# Patient Record
Sex: Female | Born: 1970 | Race: Black or African American | Hispanic: No | Marital: Single | Smoking: Never smoker
Health system: Southern US, Community
[De-identification: ages and names within clinical notes are randomized; demographics above are authoritative.]

## PROBLEM LIST (undated history)

## (undated) DIAGNOSIS — D219 Benign neoplasm of connective and other soft tissue, unspecified: Secondary | ICD-10-CM

## (undated) DIAGNOSIS — D649 Anemia, unspecified: Secondary | ICD-10-CM

---

## 1998-03-29 ENCOUNTER — Inpatient Hospital Stay (HOSPITAL_COMMUNITY): Admission: AD | Admit: 1998-03-29 | Discharge: 1998-03-29 | Payer: Self-pay | Admitting: Obstetrics and Gynecology

## 1998-10-03 ENCOUNTER — Inpatient Hospital Stay (HOSPITAL_COMMUNITY): Admission: AD | Admit: 1998-10-03 | Discharge: 1998-10-03 | Payer: Self-pay | Admitting: Obstetrics and Gynecology

## 1998-10-12 ENCOUNTER — Encounter: Payer: Self-pay | Admitting: Obstetrics & Gynecology

## 1998-10-12 ENCOUNTER — Inpatient Hospital Stay (HOSPITAL_COMMUNITY): Admission: AD | Admit: 1998-10-12 | Discharge: 1998-10-12 | Payer: Self-pay | Admitting: Obstetrics & Gynecology

## 1998-10-18 ENCOUNTER — Inpatient Hospital Stay (HOSPITAL_COMMUNITY): Admission: AD | Admit: 1998-10-18 | Discharge: 1998-10-18 | Payer: Self-pay | Admitting: Obstetrics and Gynecology

## 1998-10-25 ENCOUNTER — Inpatient Hospital Stay (HOSPITAL_COMMUNITY): Admission: AD | Admit: 1998-10-25 | Discharge: 1998-10-28 | Payer: Self-pay | Admitting: Obstetrics and Gynecology

## 1998-12-27 ENCOUNTER — Other Ambulatory Visit: Admission: RE | Admit: 1998-12-27 | Discharge: 1998-12-27 | Payer: Self-pay | Admitting: Obstetrics and Gynecology

## 1999-12-24 ENCOUNTER — Other Ambulatory Visit: Admission: RE | Admit: 1999-12-24 | Discharge: 1999-12-24 | Payer: Self-pay | Admitting: Obstetrics and Gynecology

## 2007-01-28 ENCOUNTER — Emergency Department (HOSPITAL_COMMUNITY): Admission: EM | Admit: 2007-01-28 | Discharge: 2007-01-29 | Payer: Self-pay | Admitting: Emergency Medicine

## 2008-11-12 IMAGING — CT CT UROGRAM
2 of 3 series · 15 of 42 positions shown, 19 images · non-contrast
Comparison: NONE

CLINICAL DATA: Right flank pain and hematuria. 

CT UROGRAM
TECHNIQUE: Thin-section unenhanced axial images were obtained to 
provide a CT urogram to evaluate for possible urinary tract stone. 
 Scan thicknesses were 3.0 mm with 3.0-mm increments.

[Series 2: wo · axial · 0.68mm/px · z∈[+541,+898]mm · 12 of 135 slices shown, 16 images]
[im 8/135  soft-tissue]
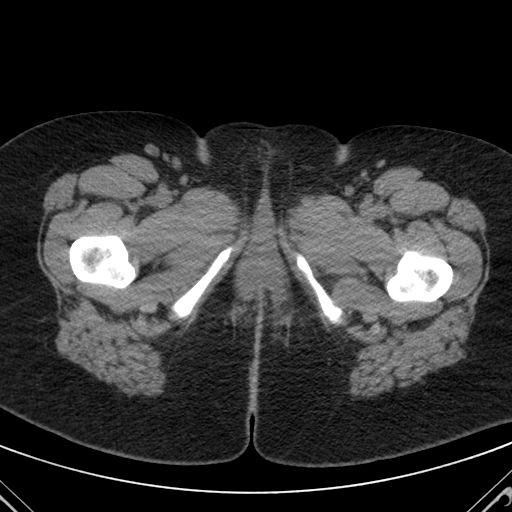
[im 8/135  bone]
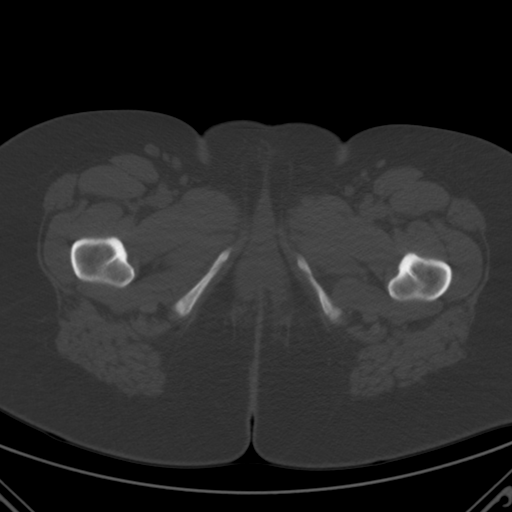
[im 22/135  soft-tissue]
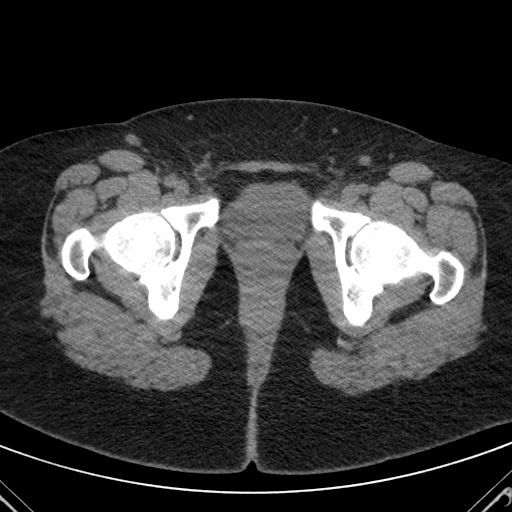
[im 36/135  soft-tissue]
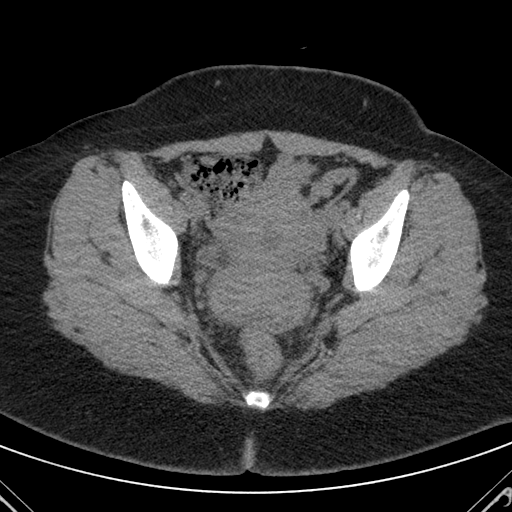
[im 50/135  soft-tissue]
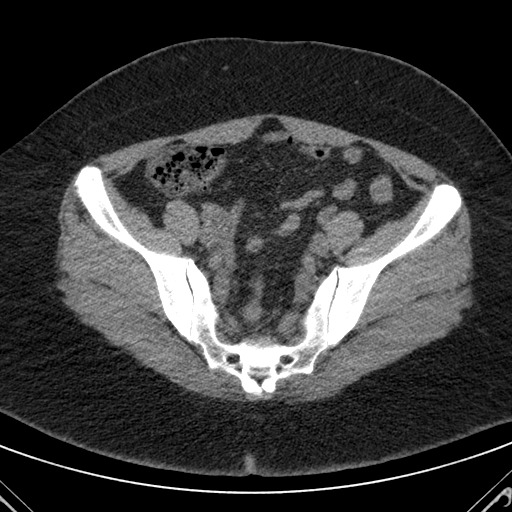
[im 64/135  soft-tissue]
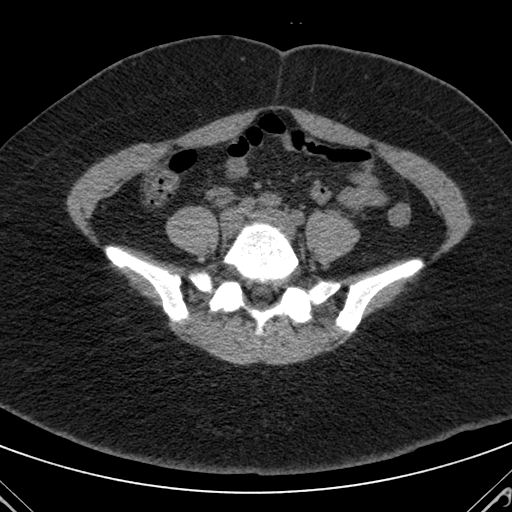
[im 71/135  soft-tissue]
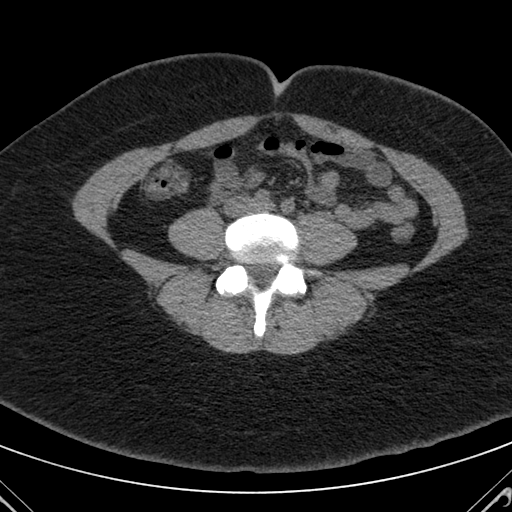
[im 85/135  soft-tissue]
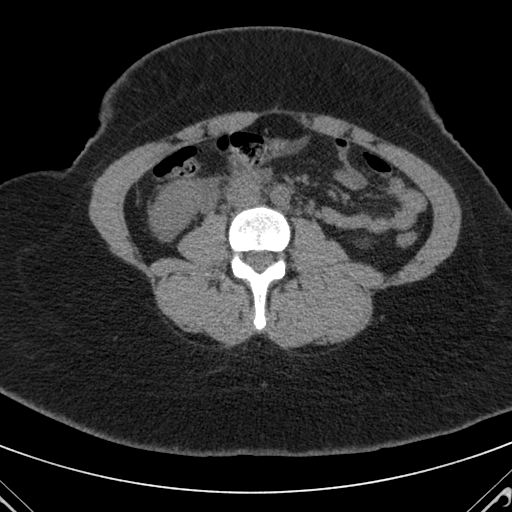
[im 99/135  soft-tissue]
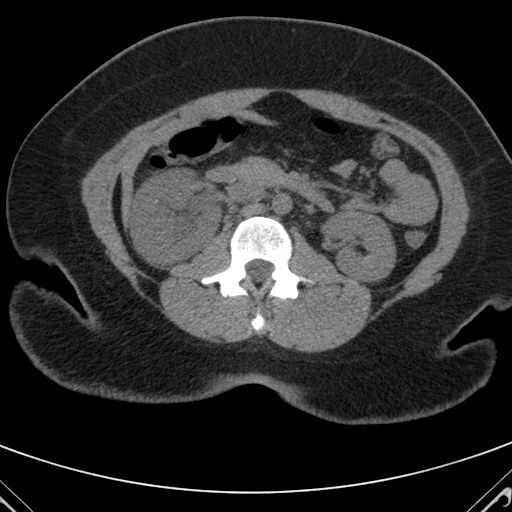
[im 106/135  lung]
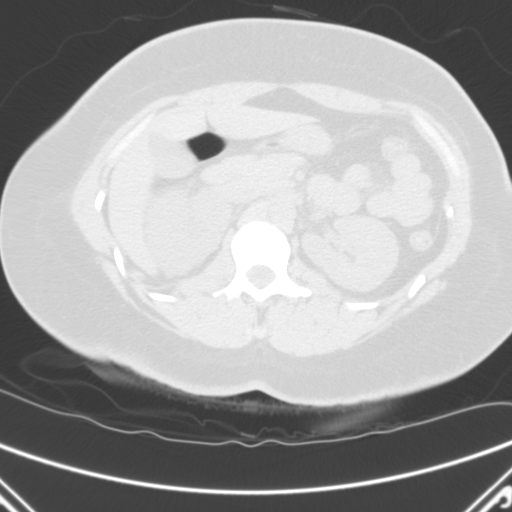
[im 113/135  soft-tissue]
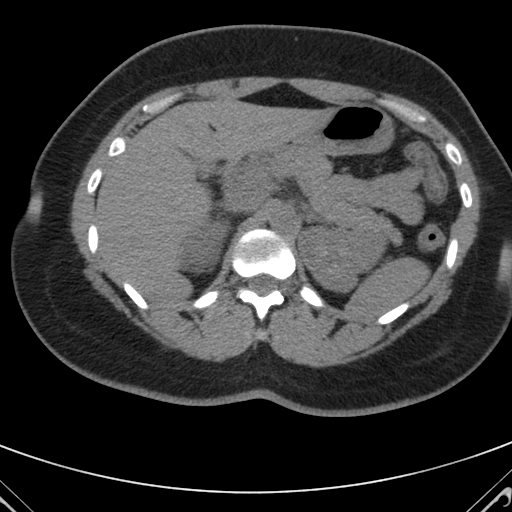
[im 113/135  lung]
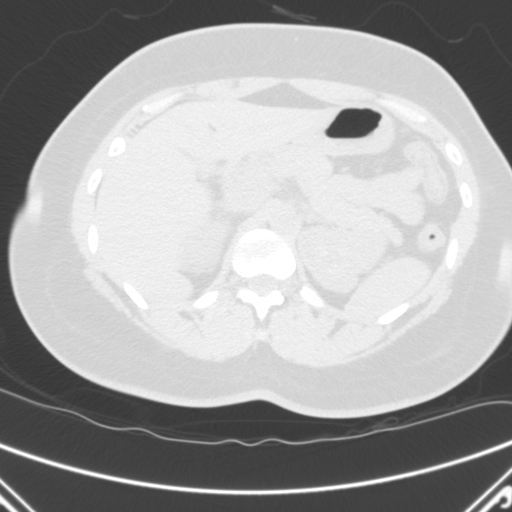
[im 113/135  bone]
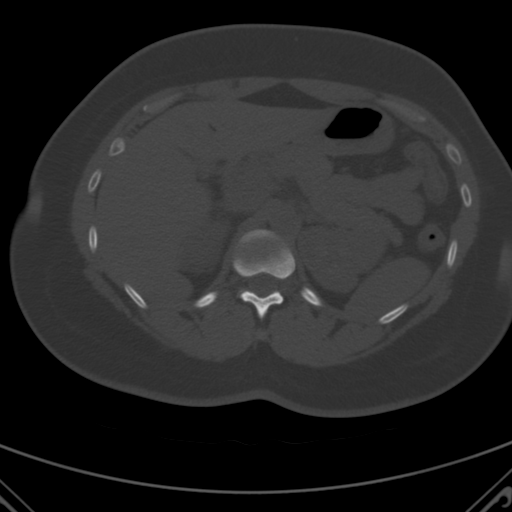
[im 120/135  lung]
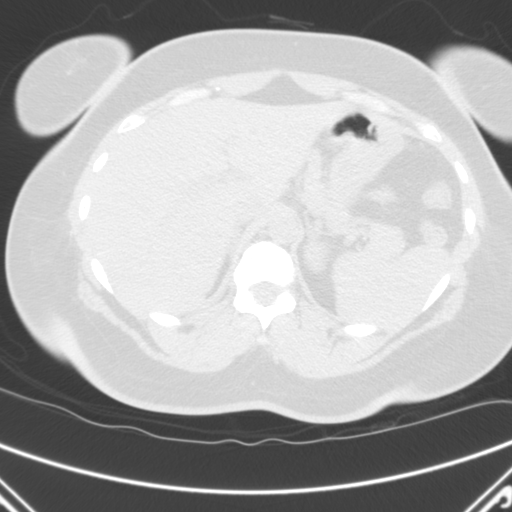
[im 127/135  soft-tissue]
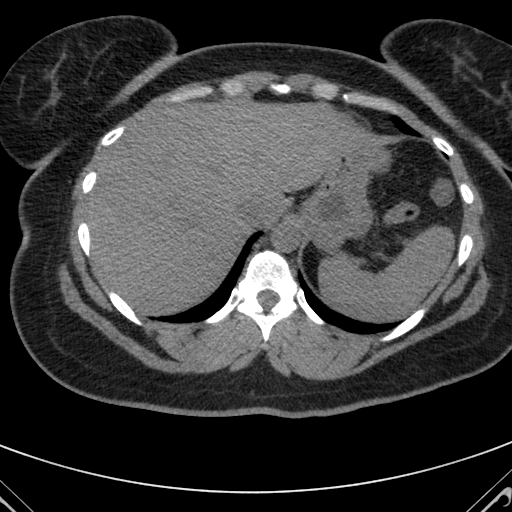
[im 127/135  lung]
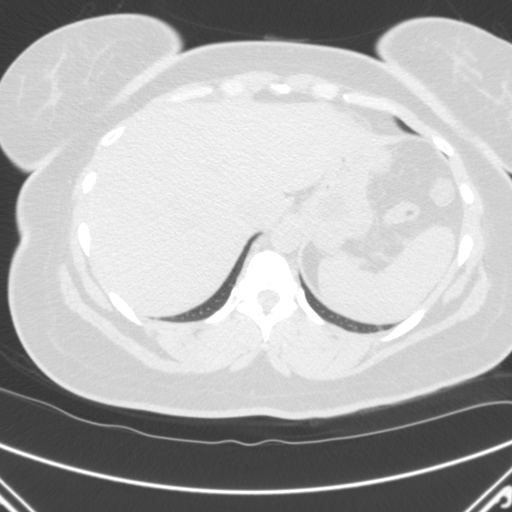

[coronals · coronal · 0.78mm/px · 3 of 69 slices shown]
[im 23/69  soft-tissue]
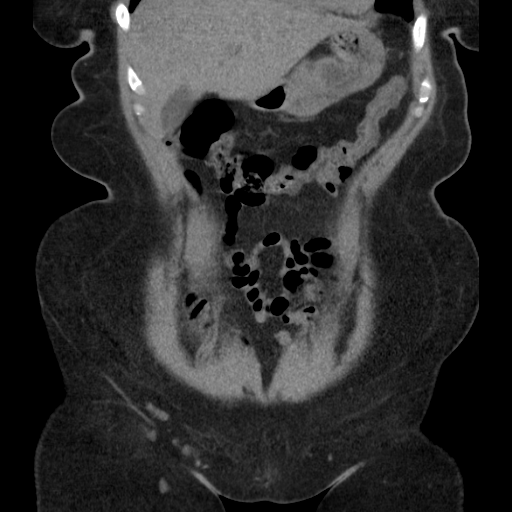
[im 31/69  soft-tissue]
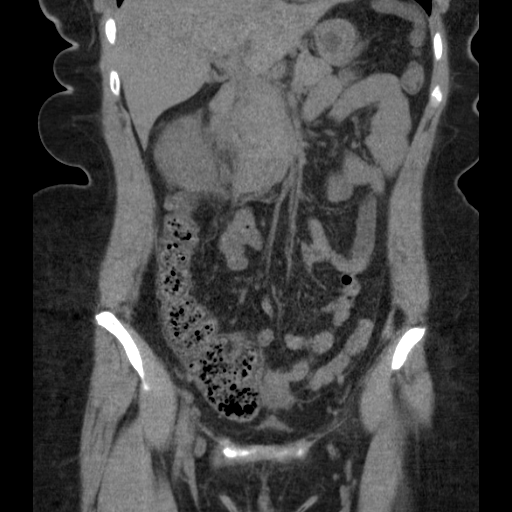
[im 38/69  soft-tissue]
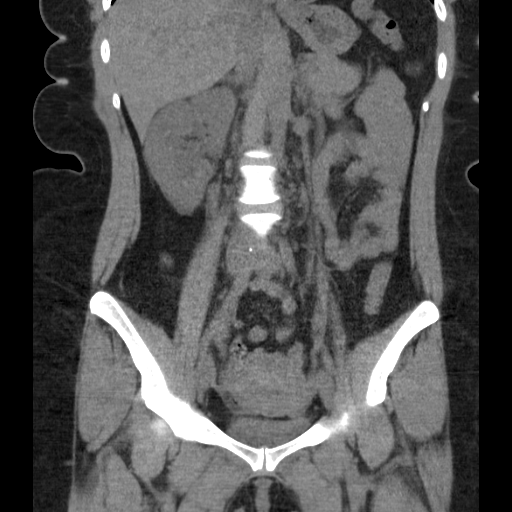

[15 of 42 positions shown; findings below may reference images not displayed]

FINDINGS: There are no prior CT studies available for comparison. 
Evaluation of the intra-abdominal organs is limited without 
intravenous contrast.  The visualized portion of the liver is 
grossly within normal limits.  The spleen, pancreas, gallbladder, 
and adrenal glands are grossly within normal limits.  There is a 
calcified stone seen in the midpole of the left kidney that 
measures approximately 6.0 mm in diameter.  There is no evidence 
left hydronephrosis or hydroureter.  There is a smaller, 
approximately 2.0-mm stone in the mid-upper pole of the left 
kidney.  A calcified stone is seen in the mid-lower pole of the 
right kidney that measures approximately 5.0 mm in length.  There 
is a smaller approximately 2.0-mm stone in the midpole of the 
right kidney.  There is moderate right hydronephrosis and moderate 
right hydroureter.  There is a calcified stone seen in the distal 
right ureter that measures approximatey 3.5x6.5 mm.  There is no 
calcified stone seen in the bladder.  The uterus is grossly within 
normal limits.  The ovaries are not well-seen. No free fluid or 
aortic aneurysm.  No anterior abdominal wall hernia or inguinal 
hernia.  There is no evidence of lymphadenopathy.  No inflammatory 
changes are seen in the abdomen or pelvis.
IMPRESSION: Bilateral nephrolithiasis. Moderate right 
hydronephrosis and right hydroureter due to a 3.5x6.5-mm calcified 
stone seen in the distal right ureter near the ureterovesical 
Dict Date: 01/28/2007  Trans Date: 01/28/2007 JH  JLM

## 2010-03-04 ENCOUNTER — Other Ambulatory Visit: Admission: RE | Admit: 2010-03-04 | Discharge: 2010-03-04 | Payer: Self-pay | Admitting: Family Medicine

## 2011-03-13 LAB — URINE MICROSCOPIC-ADD ON

## 2011-03-13 LAB — I-STAT 8, (EC8 V) (CONVERTED LAB)
Acid-base deficit: 6 — ABNORMAL HIGH
Hemoglobin: 11.2 — ABNORMAL LOW
Operator id: 272551
Potassium: 3.1 — ABNORMAL LOW
Sodium: 139
TCO2: 20

## 2011-03-13 LAB — POCT I-STAT CREATININE
Creatinine, Ser: 0.9
Operator id: 272551

## 2011-03-13 LAB — URINALYSIS, ROUTINE W REFLEX MICROSCOPIC
Glucose, UA: NEGATIVE
Ketones, ur: 15 — AB
Leukocytes, UA: NEGATIVE
Nitrite: NEGATIVE
Protein, ur: 30 — AB
Specific Gravity, Urine: 1.036 — ABNORMAL HIGH
Urobilinogen, UA: 1
pH: 6

## 2012-03-13 ENCOUNTER — Emergency Department (HOSPITAL_COMMUNITY)
Admission: EM | Admit: 2012-03-13 | Discharge: 2012-03-14 | Disposition: A | Discharge status: Home / Self Care | Payer: Medicaid Other | Attending: Emergency Medicine | Admitting: Emergency Medicine

## 2012-03-13 ENCOUNTER — Encounter (HOSPITAL_COMMUNITY): Payer: Self-pay | Admitting: *Deleted

## 2012-03-13 DIAGNOSIS — M79609 Pain in unspecified limb: Secondary | ICD-10-CM | POA: Insufficient documentation

## 2012-03-13 DIAGNOSIS — I2510 Atherosclerotic heart disease of native coronary artery without angina pectoris: Secondary | ICD-10-CM | POA: Insufficient documentation

## 2012-03-13 DIAGNOSIS — M542 Cervicalgia: Secondary | ICD-10-CM | POA: Insufficient documentation

## 2012-03-13 DIAGNOSIS — R079 Chest pain, unspecified: Secondary | ICD-10-CM | POA: Insufficient documentation

## 2012-03-13 DIAGNOSIS — N920 Excessive and frequent menstruation with regular cycle: Secondary | ICD-10-CM | POA: Insufficient documentation

## 2012-03-13 DIAGNOSIS — R000 Tachycardia, unspecified: Secondary | ICD-10-CM | POA: Insufficient documentation

## 2012-03-13 DIAGNOSIS — D649 Anemia, unspecified: Secondary | ICD-10-CM | POA: Insufficient documentation

## 2012-03-13 DIAGNOSIS — J3489 Other specified disorders of nose and nasal sinuses: Secondary | ICD-10-CM | POA: Insufficient documentation

## 2012-03-13 DIAGNOSIS — IMO0001 Reserved for inherently not codable concepts without codable children: Secondary | ICD-10-CM | POA: Insufficient documentation

## 2012-03-13 LAB — BASIC METABOLIC PANEL
BUN: 9 mg/dL (ref 6–23)
Calcium: 9.2 mg/dL (ref 8.4–10.5)
GFR calc Af Amer: >90 mL/min (ref >90)
GFR calc non Af Amer: >90 mL/min (ref >90)
Potassium: 3.4 mEq/L — ABNORMAL LOW (ref 3.5–5.1)
Sodium: 135 mEq/L (ref 135–145)

## 2012-03-13 LAB — CBC
MCH: 25.3 pg — ABNORMAL LOW (ref 26.0–34.0)
MCHC: 32.4 g/dL (ref 30.0–36.0)
RDW: 17.9 % — ABNORMAL HIGH (ref 11.5–15.5)

## 2012-03-13 MED ORDER — IBUPROFEN 200 MG PO TABS
600.0000 mg | ORAL_TABLET | Freq: Once | ORAL | Status: AC
  Administered 2012-03-13: 600 mg via ORAL
  Filled 2012-03-13: qty 1

## 2012-03-13 MED ORDER — SODIUM CHLORIDE 0.9 % IV BOLUS (SEPSIS)
1000.0000 mL | Freq: Once | INTRAVENOUS | Status: AC
  Administered 2012-03-13: 1000 mL via INTRAVENOUS

## 2012-03-13 NOTE — ED Provider Notes (Signed)
History     CSN: [id number]  Arrival date & time 03/13/12  1946   First MD Initiated Contact with Patient 03/13/12 2037      Chief Complaint  Patient presents with  . Chest Pain  . Tachycardia    (Consider location/radiation/quality/duration/timing/severity/associated sxs/prior treatment) HPI Kaitlin Harris is a 41 y.o. female without any history of coronary artery disease, or heart problems in the past presents with 3 days history of sensation in her heart rate is been going fast, feeling hot with some chest pain that has been burning, sharp, intermittent-lasting less than 5 minutes, she's had some associated left arm pains and neck pains. She says the pain does not radiate from her chest to her arm or neck. She still having it today and it comes and goes. Patient says she's also been achy all over. She denies shortness of breath. She's had no cough, no nausea vomiting or diarrhea, no abdominal pain, no dysuria, she's had some minor nasal congestion. No documented fevers at home. No history of blood clots in the legs or lungs.   History reviewed. No pertinent past medical history.  History reviewed. No pertinent past surgical history.  No family history on file.  History  Substance Use Topics  . Smoking status: Not on file  . Smokeless tobacco: Not on file  . Alcohol Use: Not on file    OB History    No data available      Review of Systems At least 10pt or greater review of systems completed and are negative except where specified in the HPI.  Allergies  Review of patient's allergies indicates no known allergies.  Home Medications  No current outpatient prescriptions on file.  BP 147/86  Temp 98.8 F (37.1 C) (Oral)  Resp 20  SpO2 98%  Physical Exam  Nursing notes reviewed.  Electronic medical record reviewed. VITAL SIGNS:   Filed Vitals:   03/13/12 2330 03/14/12 0030 03/14/12 0100 03/14/12 0145  BP: 116/69 111/67 103/63 114/75  Pulse:      Temp:     98.2 F (36.8 C)  TempSrc:      Resp: 13  12   SpO2:    100%   CONSTITUTIONAL: Awake, oriented, appears non-toxic HENT: Atraumatic, normocephalic, oral mucosa pink and moist, airway patent. Nares patent with clear drainage and moderately boggy turbinates. External ears normal. EYES: Conjunctiva clear, EOMI, PERRLA NECK: Trachea midline, non-tender, supple CARDIOVASCULAR: Normal heart rate, Normal rhythm, No murmurs, rubs, gallops PULMONARY/CHEST: Clear to auscultation, no rhonchi, wheezes, or rales. Symmetrical breath sounds. Non-tender. ABDOMINAL: Non-distended, soft, non-tender - no rebound or guarding.  BS normal. NEUROLOGIC: Non-focal, moving all four extremities, no gross sensory or motor deficits. EXTREMITIES: No clubbing, cyanosis, or edema SKIN: Warm, Dry, No erythema, No rash  ED Course  Procedures (including critical care time)  Date: 03/14/2012 19:57:41  Rate: 107  Rhythm: sinus rhythm  QRS Axis: normal  Intervals: normal  ST/T Wave abnormalities: normal  Conduction Disutrbances: none  Narrative Interpretation:  sinus tachycardia, RSR prime in V1 - no other evidence of RBBB    Date: 03/14/2012 01:02:57  Rate: 72  Rhythm: normal sinus rhythm  QRS Axis: normal  Intervals: normal  ST/T Wave abnormalities: normal  Conduction Disutrbances: none  Narrative Interpretation:  sinus tachycardia, RSR prime in V1 - no other evidence of RBBB    Labs Reviewed  CBC - Abnormal; Notable for the following:    RBC 3.60 (*)     Hemoglobin 9.1 (*)  HCT 28.1 (*)     MCH 25.3 (*)     RDW 17.9 (*)     All other components within normal limits  BASIC METABOLIC PANEL - Abnormal; Notable for the following:    Potassium 3.4 (*)     All other components within normal limits  TROPONIN I  TROPONIN I   No results found.   1. Chest pain   2. Anemia   3. Menorrhagia       MDM  Kaitlin Harris is a 41 y.o. female presenting with chest pain. She has some minor nasal  congestion. Chest pain is certainly atypical.  The emergent differential diagnosis of chest pain includes: Acute coronary syndrome, pericarditis, aortic dissection, pulmonary embolism, tension pneumothorax, and esophageal rupture. Based on the patient's benign presentation-I do not think she has acute coronary syndrome-she denies any viral prodrome and her pain is not positional, it is simply intermittent at rest. Likewise she looks good I do not think she's got an aortic dissection, her well score is 1 giving her very low pretest probability for having a PE. She's not short of breath, she simply tachycardic. She's had no appetite has had decreased by mouth intake since Friday. She's got some nasal congestion and some myalgias these are consistent with a viral process. We'll obtain cardiac biomarkers and basic labs, rule out ACS and carditis.  Do not think a chest x-ray is indicated at this time.  Patient does have a history of heavy menstrual periods she does have a prior history of also being anemic.  Patient's labs show the patient is indeed anemic with a hemoglobin of 9.1 hematocrit 28.1. Otherwise cardiac biomarkers are within normal limits and BMP is unremarkable. EKGs are unchanged and unremarkable. We'll put the patient on a regimen of iron-to include ferrous sulfate on a bowel regimen to prevent constipation. She currently does not have a primary care physician-have given her a resource guide for which to find a PCM.  I explained the diagnosis and have given explicit precautions to return to the ER including persistent chest pain, high fevers, vomiting, shortness of breath or any other new or worsening symptoms. The patient understands and accepts the medical plan as it's been dictated and I have answered their questions. Discharge instructions concerning home care and prescriptions have been given.  The patient is STABLE and is discharged to home in good condition.     Jones Skene,  MD 03/14/12 [id number]

## 2012-03-13 NOTE — ED Notes (Addendum)
Pt arrived via [facility] c/o left sided axillary pain radiating left shoulder and down left arm. Pain currently resolved. Sinus tachycardia during transport with EMS. 324 ASA administered prior to arrival at approximately 1915

## 2012-03-14 MED ORDER — FERROUS SULFATE 325 (65 FE) MG PO TABS: 325.0000 mg | ORAL_TABLET | Freq: Three times a day (TID) | ORAL | Status: DC

## 2012-03-14 MED ORDER — SODIUM CHLORIDE 0.9 % IV BOLUS (SEPSIS)
1000.0000 mL | Freq: Once | INTRAVENOUS | Status: AC
  Administered 2012-03-14: 1000 mL via INTRAVENOUS

## 2012-03-14 MED ORDER — POLYETHYLENE GLYCOL 3350 17 GM/SCOOP PO POWD: 17.0000 g | Freq: Every day | ORAL | Status: DC

## 2012-03-14 MED ORDER — ONDANSETRON HCL 4 MG/2ML IJ SOLN
4.0000 mg | Freq: Once | INTRAMUSCULAR | Status: AC
  Administered 2012-03-14: 4 mg via INTRAVENOUS
  Filled 2012-03-14: qty 2

## 2012-03-14 NOTE — ED Notes (Signed)
Pt states understanding of discharge instructions 

## 2012-03-28 ENCOUNTER — Emergency Department (HOSPITAL_COMMUNITY): Payer: Medicaid Other

## 2012-03-28 ENCOUNTER — Encounter (HOSPITAL_COMMUNITY): Payer: Self-pay | Admitting: *Deleted

## 2012-03-28 ENCOUNTER — Emergency Department (HOSPITAL_COMMUNITY)
Admission: EM | Admit: 2012-03-28 | Discharge: 2012-03-28 | Disposition: A | Discharge status: Home / Self Care | Payer: Medicaid Other | Attending: Emergency Medicine | Admitting: Emergency Medicine

## 2012-03-28 DIAGNOSIS — R5383 Other fatigue: Secondary | ICD-10-CM

## 2012-03-28 DIAGNOSIS — R002 Palpitations: Secondary | ICD-10-CM | POA: Insufficient documentation

## 2012-03-28 DIAGNOSIS — R5381 Other malaise: Secondary | ICD-10-CM | POA: Insufficient documentation

## 2012-03-28 DIAGNOSIS — Z79899 Other long term (current) drug therapy: Secondary | ICD-10-CM | POA: Insufficient documentation

## 2012-03-28 DIAGNOSIS — R059 Cough, unspecified: Secondary | ICD-10-CM | POA: Insufficient documentation

## 2012-03-28 DIAGNOSIS — R42 Dizziness and giddiness: Secondary | ICD-10-CM | POA: Insufficient documentation

## 2012-03-28 DIAGNOSIS — R05 Cough: Secondary | ICD-10-CM | POA: Insufficient documentation

## 2012-03-28 DIAGNOSIS — R0602 Shortness of breath: Secondary | ICD-10-CM | POA: Insufficient documentation

## 2012-03-28 LAB — POCT I-STAT TROPONIN I: Troponin i, poc: 0.01 ng/mL (ref 0.00–0.08)

## 2012-03-28 LAB — CBC
MCHC: 33.2 g/dL (ref 30.0–36.0)
MCV: 79.1 fL (ref 78.0–100.0)
Platelets: 227 10*3/uL (ref 150–400)
RDW: 17.7 % — ABNORMAL HIGH (ref 11.5–15.5)
WBC: 4.1 10*3/uL (ref 4.0–10.5)

## 2012-03-28 LAB — BASIC METABOLIC PANEL
BUN: 6 mg/dL (ref 6–23)
Calcium: 9.3 mg/dL (ref 8.4–10.5)
Chloride: 96 mEq/L (ref 96–112)
Creatinine, Ser: 0.59 mg/dL (ref 0.50–1.10)
GFR calc Af Amer: >90 mL/min (ref >90)
GFR calc non Af Amer: >90 mL/min (ref >90)

## 2012-03-28 MED ORDER — SODIUM CHLORIDE 0.9 % IV BOLUS (SEPSIS)
1000.0000 mL | Freq: Once | INTRAVENOUS | Status: AC
  Administered 2012-03-28: 1000 mL via INTRAVENOUS

## 2012-03-28 MED ORDER — POTASSIUM CHLORIDE CRYS ER 20 MEQ PO TBCR
40.0000 meq | EXTENDED_RELEASE_TABLET | Freq: Once | ORAL | Status: AC
  Administered 2012-03-28: 40 meq via ORAL
  Filled 2012-03-28: qty 2

## 2012-03-28 MED ORDER — LORAZEPAM 2 MG/ML IJ SOLN
1.0000 mg | Freq: Once | INTRAMUSCULAR | Status: AC
  Administered 2012-03-28: 1 mg via INTRAVENOUS
  Filled 2012-03-28: qty 1

## 2012-03-28 NOTE — ED Notes (Signed)
Pt states that she had increasing generalized weakness and fatigue today.  Pt states that she has had palpitations also.  Pt was recently seen for same (on 10/14) and never fully recovered from her symptoms.  Pt has been feeling weak and drowsy for one week.  Pt began having palpitations (heart racing) and diaphoresis today at 1am.  Pt was told last time she was seen that she has a virus and anemia.  Pt appears pale.  No CP, mild sob with this.  Pt denies any rectal bleeding and reports that her stools have been dark (not black).

## 2012-03-28 NOTE — ED Provider Notes (Signed)
History     CSN: [id number]  Arrival date & time 03/28/12  0228   First MD Initiated Contact with Patient 03/28/12 [phone]      Chief Complaint  Patient presents with  . Fatigue  . Palpitations    (Consider location/radiation/quality/duration/timing/severity/associated sxs/prior treatment) HPI HX per PT< fatigue and feeling weak all over last few weeks, tonight at home developed palpitations lasting about an hour. Was evaluated for the same 2 weeks ago and told she was anemic, she denies any rectal bleeding. LMP last  Month on time and normal. Has had a few of these episodes in the last few weeks. No CP. Some SOB with palpitations, those symptoms now gone. Mild dry cough and congestion - not taking any medications for this.  No h/o thyroid problems History reviewed. No pertinent past medical history.  History reviewed. No pertinent past surgical history.  Family History  Problem Relation Age of Onset  . Asthma Mother   . Thyroid disease Sister   . Asthma Brother     History  Substance Use Topics  . Smoking status: Never Smoker   . Smokeless tobacco: Never Used  . Alcohol Use: 0.6 oz/week    1 Glasses of wine per week    OB History    Grav Para Term Preterm Abortions TAB SAB Ect Mult Living                  Review of Systems  Constitutional: Negative for fever and chills.  HENT: Negative for neck pain and neck stiffness.   Eyes: Negative for pain.  Respiratory: Positive for cough. Negative for wheezing.   Cardiovascular: Negative for chest pain.  Gastrointestinal: Negative for nausea, vomiting and abdominal pain.  Genitourinary: Negative for dysuria.  Musculoskeletal: Negative for back pain.  Skin: Negative for rash.  Neurological: Positive for dizziness and weakness. Negative for headaches.  All other systems reviewed and are negative.    Allergies  Review of patient's allergies indicates no known allergies.  Home Medications   Current Outpatient Rx  Name  Route Sig Dispense Refill  . FERROUS SULFATE 325 (65 FE) MG PO TABS Oral Take 1 tablet (325 mg total) by mouth 3 (three) times daily with meals. 90 tablet 3  . ADULT MULTIVITAMIN W/MINERALS CH Oral Take 1 tablet by mouth daily.      BP 159/103  Pulse 108  Temp 97.8 F (36.6 C) (Oral)  Resp 18  SpO2 100%  LMP 03/13/2012  Physical Exam  Constitutional: She is oriented to person, place, and time. She appears well-developed and well-nourished.  HENT:  Head: Normocephalic and atraumatic.  Eyes: Conjunctivae normal and EOM are normal. Pupils are equal, round, and reactive to light.  Neck: Trachea normal. Neck supple. No thyromegaly present.  Cardiovascular: Regular rhythm, S1 normal, S2 normal and normal pulses.     No systolic murmur is present   No diastolic murmur is present  Pulses:      Radial pulses are 2+ on the right side, and 2+ on the left side.       Mild tachycardia  Pulmonary/Chest: Effort normal and breath sounds normal. She has no wheezes. She has no rhonchi. She has no rales. She exhibits no tenderness.  Abdominal: Soft. Normal appearance and bowel sounds are normal. There is no tenderness. There is no CVA tenderness and negative Murphy's sign.  Genitourinary:       Guaiac neg brown stool  Musculoskeletal:       BLE:s Calves  nontender, no cords or erythema, negative Homans sign  Neurological: She is alert and oriented to person, place, and time. She has normal strength. No cranial nerve deficit or sensory deficit. GCS eye subscore is 4. GCS verbal subscore is 5. GCS motor subscore is 6.  Skin: Skin is warm and dry. No rash noted. She is not diaphoretic.  Psychiatric: Her speech is normal.       Cooperative and appropriate    ED Course  Procedures (including critical care time)  Results for orders placed during the hospital encounter of 03/28/12  CBC      Component Value Range   WBC 4.1  4.0 - 10.5 K/uL   RBC 3.92  3.87 - 5.11 MIL/uL   Hemoglobin 10.3 (*) 12.0 -  15.0 g/dL   HCT 16.1 (*) 09.6 - 04.5 %   MCV 79.1  78.0 - 100.0 fL   MCH 26.3  26.0 - 34.0 pg   MCHC 33.2  30.0 - 36.0 g/dL   RDW 40.9 (*) 81.1 - 91.4 %   Platelets 227  150 - 400 K/uL  BASIC METABOLIC PANEL      Component Value Range   Sodium 133 (*) 135 - 145 mEq/L   Potassium 3.2 (*) 3.5 - 5.1 mEq/L   Chloride 96  96 - 112 mEq/L   CO2 22  19 - 32 mEq/L   Glucose, Bld 87  70 - 99 mg/dL   BUN 6  6 - 23 mg/dL   Creatinine, Ser 7.82  0.50 - 1.10 mg/dL   Calcium 9.3  8.4 - 95.6 mg/dL   GFR calc non Af Amer >90  >90 mL/min   GFR calc Af Amer >90  >90 mL/min  POCT I-STAT TROPONIN I      Component Value Range   Troponin i, poc 0.01  0.00 - 0.08 ng/mL   Comment 3           OCCULT BLOOD, POC DEVICE      Component Value Range   Fecal Occult Bld NEGATIVE     Dg Chest Portable 1 View  03/28/2012  *RADIOLOGY REPORT*  Clinical Data: Fatigue.  Chest palpitations.  PORTABLE CHEST - 1 VIEW  Comparison: None.  Findings: Lungs are clear.  Heart size is normal.  No pneumothorax or pleural fluid.  IMPRESSION: Negative chest.   Original Report Authenticated By: Bernadene Bell. Maricela Curet, M.D.      Date: 03/28/2012  Rate: 97  Rhythm: normal sinus rhythm  QRS Axis: normal  Intervals: normal  ST/T Wave abnormalities: nonspecific ST changes  Conduction Disutrbances:none  Narrative Interpretation:   Old EKG Reviewed: unchanged  IVFs. IV ativan. Labs and CXR  Potassium for hypokalemia.   4:39 AM recheck feels improved - mild tachycardia improved.   Plan outpatient follow up - PT agrees to have thyroid checked and see primary care physician for further work up.  MDM   Fatigue and palpitations x 2 weeks intermittent, known anemia, no active bleeding or change in menses, taking iron pills. Work up with ECG, CXR and labs as above. Hypokalemia treated. VS and nursing notes reviewed.          Sunnie Nielsen, MD 03/28/12 [phone]

## 2012-06-08 ENCOUNTER — Encounter (HOSPITAL_COMMUNITY): Payer: Self-pay

## 2012-06-08 ENCOUNTER — Emergency Department (INDEPENDENT_AMBULATORY_CARE_PROVIDER_SITE_OTHER)
Admission: EM | Admit: 2012-06-08 | Discharge: 2012-06-08 | Disposition: A | Discharge status: Home / Self Care | Payer: Self-pay | Source: Home / Self Care

## 2012-06-08 DIAGNOSIS — F329 Major depressive disorder, single episode, unspecified: Secondary | ICD-10-CM

## 2012-06-08 DIAGNOSIS — F411 Generalized anxiety disorder: Secondary | ICD-10-CM

## 2012-06-08 DIAGNOSIS — F419 Anxiety disorder, unspecified: Secondary | ICD-10-CM

## 2012-06-08 DIAGNOSIS — N920 Excessive and frequent menstruation with regular cycle: Secondary | ICD-10-CM

## 2012-06-08 DIAGNOSIS — I10 Essential (primary) hypertension: Secondary | ICD-10-CM

## 2012-06-08 DIAGNOSIS — R5382 Chronic fatigue, unspecified: Secondary | ICD-10-CM

## 2012-06-08 DIAGNOSIS — D649 Anemia, unspecified: Secondary | ICD-10-CM

## 2012-06-08 DIAGNOSIS — R5381 Other malaise: Secondary | ICD-10-CM

## 2012-06-08 LAB — COMPREHENSIVE METABOLIC PANEL
AST: 17 U/L (ref 0–37)
Albumin: 4.3 g/dL (ref 3.5–5.2)
Calcium: 9.6 mg/dL (ref 8.4–10.5)
Creatinine, Ser: 0.54 mg/dL (ref 0.50–1.10)

## 2012-06-08 LAB — CBC
MCH: 28.4 pg (ref 26.0–34.0)
MCV: 83.1 fL (ref 78.0–100.0)
Platelets: 185 10*3/uL (ref 150–400)
RDW: 15.2 % (ref 11.5–15.5)
WBC: 3.1 10*3/uL — ABNORMAL LOW (ref 4.0–10.5)

## 2012-06-08 LAB — VITAMIN B12: Vitamin B-12: 944 pg/mL — ABNORMAL HIGH (ref 211–911)

## 2012-06-08 MED ORDER — HYDROCHLOROTHIAZIDE 12.5 MG PO TABS: 12.5000 mg | ORAL_TABLET | Freq: Every day | ORAL | Status: DC

## 2012-06-08 MED ORDER — CITALOPRAM HYDROBROMIDE 20 MG PO TABS: 20.0000 mg | ORAL_TABLET | Freq: Every day | ORAL | Status: DC

## 2012-06-08 MED ORDER — LORAZEPAM 0.5 MG PO TABS: 0.5000 mg | ORAL_TABLET | Freq: Every evening | ORAL | Status: DC | PRN

## 2012-06-08 MED ORDER — POTASSIUM CHLORIDE ER 10 MEQ PO TBCR: 10.0000 meq | EXTENDED_RELEASE_TABLET | Freq: Every day | ORAL | Status: DC

## 2012-06-08 NOTE — ED Notes (Signed)
Patient states has been feeling weak, feet and hand tingly  At times. Has lost weight appetite is down. Was seen in the ED twice in the month of Oct. Was told she was anemic and had a bacterial infection. Still does not feel 100 %

## 2012-06-08 NOTE — Discharge Instructions (Signed)
Anemia, Frequently Asked Questions WHAT ARE THE SYMPTOMS OF ANEMIA?  Headache.  Difficulty thinking.  Fatigue.  Shortness of breath.  Weakness.  Rapid heartbeat. AT WHAT POINT ARE PEOPLE CONSIDERED ANEMIC?  This varies with gender and age.   Both hemoglobin (Hgb) and hematocrit values are used to define anemia. These lab values are obtained from a complete blood count (CBC) test. This is performed at a caregiver's office.  The normal range of hemoglobin values for adult men is 14.0 g/dL to 16.1 g/dL. For nonpregnant women, values are 12.3 g/dL to 09.6 g/dL.  The World Health Organization defines anemia as less than 12 g/dL for nonpregnant women and less than 13 g/dL for men.  For adult males, the average normal hematocrit is 46%, and the range is 40% to 52%.  For adult females, the average normal hematocrit is 41%, and the range is 35% to 47%.  Values that fall below the lower limits can be a sign of anemia and should have further checking (evaluation). GROUPS OF PEOPLE WHO ARE AT RISK FOR DEVELOPING ANEMIA INCLUDE:   Infants who are breastfed or taking a formula that is not fortified with iron.  Children going through a rapid growth spurt. The iron available can not keep up with the needs for a red cell mass which must grow with the child.  Women in childbearing years. They need iron because of blood loss during menstruation.  Pregnant women. The growing fetus creates a high demand for iron.  People with ongoing gastrointestinal blood loss are at risk of developing iron deficiency.  Individuals with leukemia or cancer who must receive chemotherapy or radiation to treat their disease. The drugs or radiation used to treat these diseases often decreases the bone marrow's ability to make cells of all classes. This includes red blood cells, white blood cells, and platelets.  Individuals with chronic inflammatory conditions such as rheumatoid arthritis or chronic  infections.  The elderly. ARE SOME TYPES OF ANEMIA INHERITED?   Yes, some types of anemia are due to inherited or genetic defects.  Sickle cell anemia. This occurs most often in people of African, African American, and Mediterranean descent.  Thalassemia (or Cooley's anemia). This type is found in people of Mediterranean and Southeast Asian descent. These types of anemia are common.  [place]. This is rare. CAN CERTAIN MEDICATIONS CAUSE A PERSON TO BECOME ANEMIC?  Yes. For example, drugs to fight cancer (chemotherapeutic agents) often cause anemia. These drugs can slow the bone marrow's ability to make red blood cells. If there are not enough red blood cells, the body does not get enough oxygen. WHAT HEMATOCRIT LEVEL IS REQUIRED TO DONATE BLOOD?  The lower limit of an acceptable hematocrit for blood donors is 38%. If you have a low hematocrit value, you should schedule an appointment with your caregiver. ARE BLOOD TRANSFUSIONS COMMONLY USED TO CORRECT ANEMIA, AND ARE THEY DANGEROUS?  They are used to treat anemia as a last resort. Your caregiver will find the cause of the anemia and correct it if possible. Most blood transfusions are given because of excessive bleeding at the time of surgery, with trauma, or because of bone marrow suppression in patients with cancer or leukemia on chemotherapy. Blood transfusions are safer than ever before. We also know that blood transfusions affect the immune system and may increase certain risks. There is also a concern for human error. In 1/16,000 transfusions, a patient receives a transfusion of blood that is not matched with his or her  blood type.  WHAT IS IRON DEFICIENCY ANEMIA AND CAN I CORRECT IT BY CHANGING MY DIET?  Iron is an essential part of hemoglobin. Without enough hemoglobin, anemia develops and the body does not get the right amount of oxygen. Iron deficiency anemia develops after the body has had a low level of iron for a long time. This is  either caused by blood loss, not taking in or absorbing enough iron, or increased demands for iron (like pregnancy or rapid growth).  Foods from animal origin such as beef, chicken, and pork, are good sources of iron. Be sure to have one of these foods at each meal. Vitamin C helps your body absorb iron. Foods rich in Vitamin C include citrus, bell pepper, strawberries, spinach and cantaloupe. In some cases, iron supplements may be needed in order to correct the iron deficiency. In the case of poor absorption, extra iron may have to be given directly into the vein through a needle (intravenously). I HAVE BEEN DIAGNOSED WITH IRON DEFICIENCY ANEMIA AND MY CAREGIVER PRESCRIBED IRON SUPPLEMENTS. HOW LONG WILL IT TAKE FOR MY BLOOD TO BECOME NORMAL?  It depends on the degree of anemia at the beginning of treatment. Most people with mild to moderate iron deficiency, anemia will correct the anemia over a period of 2 to 3 months. But after the anemia is corrected, the iron stored by the body is still low. Caregivers often suggest an additional 6 months of oral iron therapy once the anemia has been reversed. This will help prevent the iron deficiency anemia from quickly happening again. Non-anemic adult males should take iron supplements only under the direction of a doctor, too much iron can cause liver damage.  MY HEMOGLOBIN IS 9 G/DL AND I AM SCHEDULED FOR SURGERY. SHOULD I POSTPONE THE SURGERY?  If you have Hgb of 9, you should discuss this with your caregiver right away. Many patients with similar hemoglobin levels have had surgery without problems. If minimal blood loss is expected for a minor procedure, no treatment may be necessary.  If a greater blood loss is expected for more extensive procedures, you should ask your caregiver about being treated with erythropoietin and iron. This is to accelerate the recovery of your hemoglobin to a normal level before surgery. An anemic patient who undergoes high-blood-loss  surgery has a greater risk of surgical complications and need for a blood transfusion, which also carries some risk.  I HAVE BEEN TOLD THAT HEAVY MENSTRUAL PERIODS CAUSE ANEMIA. IS THERE ANYTHING I CAN DO TO PREVENT THE ANEMIA?  Anemia that results from heavy periods is usually due to iron deficiency. You can try to meet the increased demands for iron caused by the heavy monthly blood loss by increasing the intake of iron-rich foods. Iron supplements may be required. Discuss your concerns with your caregiver. WHAT CAUSES ANEMIA DURING PREGNANCY?  Pregnancy places major demands on the body. The mother must meet the needs of both her body and her growing baby. The body needs enough iron and folate to make the right amount of red blood cells. To prevent anemia while pregnant, the mother should stay in close contact with her caregiver.  Be sure to eat a diet that has foods rich in iron and folate like liver and dark green leafy vegetables. Folate plays an important role in the normal development of a baby's spinal cord. Folate can help prevent serious disorders like spina bifida. If your diet does not provide adequate nutrients, you may want to talk  with your caregiver about nutritional supplements.  WHAT IS THE RELATIONSHIP BETWEEN FIBROID TUMORS AND ANEMIA IN WOMEN?  The relationship is usually caused by the increased menstrual blood loss caused by fibroids. Good iron intake may be required to prevent iron deficiency anemia from developing.  Document Released: 12/25/2003 Document Revised: 08/10/2011 Document Reviewed: 06/10/2010 [facility] Patient Information 2013 [place], [place]. Hypertension As your heart beats, it forces blood through your arteries. This force is your blood pressure. If the pressure is too high, it is called hypertension (HTN) or high blood pressure. HTN is dangerous because you may have it and not know it. High blood pressure may mean that your heart has to work harder to pump blood. Your  arteries may be narrow or stiff. The extra work puts you at risk for heart disease, stroke, and other problems.  Blood pressure consists of two numbers, a higher number over a lower, 110/72, for example. It is stated as "110 over 72." The ideal is below 120 for the top number (systolic) and under 80 for the bottom (diastolic). Write down your blood pressure today. You should pay close attention to your blood pressure if you have certain conditions such as:  Heart failure.  Prior heart attack.  Diabetes  Chronic kidney disease.  Prior stroke.  Multiple risk factors for heart disease. To see if you have HTN, your blood pressure should be measured while you are seated with your arm held at the level of the heart. It should be measured at least twice. A one-time elevated blood pressure reading (especially in the Emergency Department) does not mean that you need treatment. There may be conditions in which the blood pressure is different between your right and left arms. It is important to see your caregiver soon for a recheck. Most people have essential hypertension which means that there is not a specific cause. This type of high blood pressure may be lowered by changing lifestyle factors such as:  Stress.  Smoking.  Lack of exercise.  Excessive weight.  Drug/tobacco/alcohol use.  Eating less salt. Most people do not have symptoms from high blood pressure until it has caused damage to the body. Effective treatment can often prevent, delay or reduce that damage. TREATMENT  When a cause has been identified, treatment for high blood pressure is directed at the cause. There are a large number of medications to treat HTN. These fall into several categories, and your caregiver will help you select the medicines that are best for you. Medications may have side effects. You should review side effects with your caregiver. If your blood pressure stays high after you have made lifestyle changes or  started on medicines,   Your medication(s) may need to be changed.  Other problems may need to be addressed.  Be certain you understand your prescriptions, and know how and when to take your medicine.  Be sure to follow up with your caregiver within the time frame advised (usually within two weeks) to have your blood pressure rechecked and to review your medications.  If you are taking more than one medicine to lower your blood pressure, make sure you know how and at what times they should be taken. Taking two medicines at the same time can result in blood pressure that is too low. SEEK IMMEDIATE MEDICAL CARE IF:  You develop a severe headache, blurred or changing vision, or confusion.  You have unusual weakness or numbness, or a faint feeling.  You have severe chest or abdominal pain,  vomiting, or breathing problems. MAKE SURE YOU:   Understand these instructions.  Will watch your condition.  Will get help right away if you are not doing well or get worse. Document Released: 05/18/2005 Document Revised: 08/10/2011 Document Reviewed: 01/06/2008 [facility] Patient Information 2013 [place], [place]. Anxiety and Panic Attacks Anxiety is your body's way of reacting to real danger or something you think is a danger. It may be fear or worry over a situation like losing your job. Sometimes the cause is not known. A panic attack is made up of physical signs like sweating, shaking, or chest pain. Anxiety and panic attacks may start suddenly. They may be strong. They may come at any time of day, even while sleeping. They may come at any time of life. Panic attacks are scary, but they do not harm you physically.  HOME CARE  Avoid any known causes of your anxiety.  Try to relax. Yoga may help. Tell yourself everything will be okay.  Exercise often.  Get expert advice and help (therapy) to stop anxiety or attacks from happening.  Avoid caffeine, alcohol, and drugs.  Only take medicine as  told by your doctor. GET HELP RIGHT AWAY IF:  Your attacks seem different than normal attacks.  Your problems are getting worse or concern you. MAKE SURE YOU:  Understand these instructions.  Will watch your condition.  Will get help right away if you are not doing well or get worse. Document Released: 06/20/2010 Document Revised: 08/10/2011 Document Reviewed: 06/20/2010 [facility] Patient Information 2013 [place], [place]. Fatigue Fatigue is a feeling of tiredness, lack of energy, lack of motivation, or feeling tired all the time. Having enough rest, good nutrition, and reducing stress will normally reduce fatigue. Consult your caregiver if it persists. The nature of your fatigue will help your caregiver to find out its cause. The treatment is based on the cause.  CAUSES  There are many causes for fatigue. Most of the time, fatigue can be traced to one or more of your habits or routines. Most causes fit into one or more of three general areas. They are: Lifestyle problems  Sleep disturbances.  Overwork.  Physical exertion.  Unhealthy habits.  Poor eating habits or eating disorders.  Alcohol and/or drug use .  Lack of proper nutrition (malnutrition). Psychological problems  Stress and/or anxiety problems.  Depression.  Grief.  Boredom. Medical Problems or Conditions  Anemia.  Pregnancy.  Thyroid gland problems.  Recovery from major surgery.  Continuous pain.  Emphysema or asthma that is not well controlled  Allergic conditions.  Diabetes.  Infections (such as mononucleosis).  Obesity.  Sleep disorders, such as sleep apnea.  Heart failure or other heart-related problems.  Cancer.  Kidney disease.  Liver disease.  Effects of certain medicines such as antihistamines, cough and cold remedies, prescription pain medicines, heart and blood pressure medicines, drugs used for treatment of cancer, and some antidepressants. SYMPTOMS  The symptoms of  fatigue include:   Lack of energy.  Lack of drive (motivation).  Drowsiness.  Feeling of indifference to the surroundings. DIAGNOSIS  The details of how you feel help guide your caregiver in finding out what is causing the fatigue. You will be asked about your present and past health condition. It is important to review all medicines that you take, including prescription and non-prescription items. A thorough exam will be done. You will be questioned about your feelings, habits, and normal lifestyle. Your caregiver may suggest blood tests, urine tests, or other tests to  look for common medical causes of fatigue.  TREATMENT  Fatigue is treated by correcting the underlying cause. For example, if you have continuous pain or depression, treating these causes will improve how you feel. Similarly, adjusting the dose of certain medicines will help in reducing fatigue.  HOME CARE INSTRUCTIONS   Try to get the required amount of good sleep every night.  Eat a healthy and nutritious diet, and drink enough water throughout the day.  Practice ways of relaxing (including yoga or meditation).  Exercise regularly.  Make plans to change situations that cause stress. Act on those plans so that stresses decrease over time. Keep your work and personal routine reasonable.  Avoid street drugs and minimize use of alcohol.  Start taking a daily multivitamin after consulting your caregiver. SEEK MEDICAL CARE IF:   You have persistent tiredness, which cannot be accounted for.  You have fever.  You have unintentional weight loss.  You have headaches.  You have disturbed sleep throughout the night.  You are feeling sad.  You have constipation.  You have dry skin.  You have gained weight.  You are taking any new or different medicines that you suspect are causing fatigue.  You are unable to sleep at night.  You develop any unusual swelling of your legs or other parts of your body. SEEK  IMMEDIATE MEDICAL CARE IF:   You are feeling confused.  Your vision is blurred.  You feel faint or pass out.  You develop severe headache.  You develop severe abdominal, pelvic, or back pain.  You develop chest pain, shortness of breath, or an irregular or fast heartbeat.  You are unable to pass a normal amount of urine.  You develop abnormal bleeding such as bleeding from the rectum or you vomit blood.  You have thoughts about harming yourself or committing suicide.  You are worried that you might harm someone else. MAKE SURE YOU:   Understand these instructions.  Will watch your condition.  Will get help right away if you are not doing well or get worse. Document Released: 03/15/2007 Document Revised: 08/10/2011 Document Reviewed: 03/15/2007 [facility] Patient Information 2013 [place], [place].

## 2012-06-08 NOTE — ED Provider Notes (Signed)
History    CSN: [id number]  Arrival date & time 06/08/12  1108  Chief Complaint  Patient presents with  . Weakness   The history is provided by the patient. No language interpreter was used.   Pt says she says she has been dealing with chronic fatigue for the last several months.   Pt is having heavy periods.  Pt having anxiety that is getting really bad with some associated depressive symptoms anhedonia and poor motivation and not sleeping well.  Pt says she is not thinking of hurting herself or others.  Pt says that she doesn't want to be alone at times because of the anxiety.  She was told that she had a history of elevated blood pressures in the past but was never treated for hypertension.  Her blood pressure is elevated today and she is somewhat surprised.  I reviewed some of her records from October 2012 that did reveal an elevated blood pressure at that time.  I found some records from 2008 but I could not find an old blood pressure at that time.  History reviewed. No pertinent past medical history.  History reviewed. No pertinent past surgical history.  Family History  Problem Relation Age of Onset  . Asthma Mother   . Thyroid disease Sister   . Asthma Brother     History  Substance Use Topics  . Smoking status: Never Smoker   . Smokeless tobacco: Never Used  . Alcohol Use: 0.6 oz/week    1 Glasses of wine per week    OB History    Grav Para Term Preterm Abortions TAB SAB Ect Mult Living                 Review of Systems  Constitutional: Positive for fatigue and unexpected weight change.  HENT: Positive for rhinorrhea and postnasal drip.   Neurological: Positive for tremors, weakness, light-headedness and headaches.  Psychiatric/Behavioral: Positive for behavioral problems and dysphoric mood.  All other systems reviewed and are negative.    Allergies  Review of patient's allergies indicates no known allergies.  Home Medications   Current Outpatient Rx  Name   Route  Sig  Dispense  Refill  . FERROUS SULFATE 325 (65 FE) MG PO TABS   Oral   Take 1 tablet (325 mg total) by mouth 3 (three) times daily with meals.   90 tablet   3   . ADULT MULTIVITAMIN W/MINERALS CH   Oral   Take 1 tablet by mouth daily.           BP 173/88  Pulse 109  Temp 97.8 F (36.6 C) (Oral)  Resp 19  SpO2 100%  Physical Exam  Nursing note and vitals reviewed. Constitutional: She is oriented to person, place, and time. She appears well-developed and well-nourished. She does not appear ill. No distress.  HENT:  Head: Normocephalic and atraumatic.  Eyes: EOM are normal. Pupils are equal, round, and reactive to light.  Neck: Normal range of motion. Neck supple. No JVD present. No tracheal deviation present. No thyromegaly present.  Cardiovascular: Normal rate, regular rhythm and normal heart sounds.   Pulmonary/Chest: Effort normal and breath sounds normal. Rales: patient appears anxious.  Abdominal: Soft. Bowel sounds are normal. There is no tenderness.  Musculoskeletal: Normal range of motion. She exhibits no edema and no tenderness.  Lymphadenopathy:    She has no cervical adenopathy.  Neurological: She is alert and oriented to person, place, and time.  Skin: Skin is warm  and dry. No erythema.  Psychiatric: Judgment and thought content normal. Her mood appears anxious. She is is hyperactive.    ED Course  Procedures (including critical care time)  Labs Reviewed - No data to display No results found.  No diagnosis found.  MDM  IMPRESSION  Hypertension, untreated  Anemia  Menorrhagia  Anxiety Disorder with Depression  Chronic Fatigue  Hypokalemia  Chronic insomnia   RECOMMENDATIONS / PLAN  I like to order lab panel today including a metabolic panel and thyroid stimulating hormone.  I like to check B12 and vitamin D.  I'm going to start the patient on citalopram 20 mg daily for her anxiety.  Because she's having significant insomnia and her  anxiety is fairly severe am going to give her a short-term course of lorazepam 0.5 mg tablets to take one tablet each bedtime when necessary severe anxiety and insomnia.  In addition, I explained to the patient that citalopram will likely take for 6 weeks for full therapeutic benefit.  The patient verbalized understanding.  Hypertension will be treated with hydrochlorothiazide 12.5 mg by mouth daily.  In addition, I ordered for her to start potassium chloride 10 mEq by mouth daily given that her previous labs reveal that she has been hypokalemic.  The patient does report that she's been having cramping in the muscles of the legs feet and hands.  FOLLOW UP  one month for followup of the anxiety and citalopram and blood pressure I asked the patient please call our office in 2 weeks with blood pressure readings from her home blood pressure cuff  The patient was given clear instructions to go to ER or return to medical center if symptoms don't improve, worsen or new problems develop.  The patient verbalized understanding.  The patient was told to call to get lab results if they haven't heard anything in the next week.           Cleora Fleet, MD 06/08/12 [phone]

## 2012-06-09 LAB — VITAMIN D 25 HYDROXY (VIT D DEFICIENCY, FRACTURES): Vit D, 25-Hydroxy: 28 ng/mL — ABNORMAL LOW (ref 30–89)

## 2012-06-13 ENCOUNTER — Telehealth (HOSPITAL_COMMUNITY): Payer: Self-pay | Admitting: Family Medicine

## 2012-06-13 NOTE — Telephone Encounter (Signed)
Message copied by Lestine Mount on Mon Jun 13, 2012  6:41 PM ------      Message from: Cleora Fleet      Created: Thu Jun 09, 2012 11:03 PM       Please notify patient that her metabolic panel came back normal.  Her potassium was low normal.  Other labs came back OK. REcheck labs in 3 months.                   Rodney Langton, MD, CDE, FAAFP      [facility]      [facility]      [place], [place]

## 2012-06-14 ENCOUNTER — Telehealth (HOSPITAL_COMMUNITY): Payer: Self-pay

## 2012-06-14 NOTE — Telephone Encounter (Signed)
Message copied by Lestine Mount on Tue Jun 14, 2012  3:19 PM ------      Message from: Cleora Fleet      Created: Thu Jun 09, 2012 11:03 PM       Please notify patient that her metabolic panel came back normal.  Her potassium was low normal.  Other labs came back OK. REcheck labs in 3 months.                   Rodney Langton, MD, CDE, FAAFP      [facility]      [facility]      [place], [place]

## 2012-06-14 NOTE — ED Notes (Signed)
Was referred to [facility] gyn-appt. 06/16/12 @3 :30pm

## 2012-06-16 ENCOUNTER — Encounter: Payer: Self-pay | Admitting: Medical

## 2012-06-21 NOTE — ED Notes (Signed)
Pt. [facility] did not want the appt.

## 2012-07-19 ENCOUNTER — Telehealth (HOSPITAL_COMMUNITY): Payer: Self-pay

## 2012-09-23 ENCOUNTER — Encounter (HOSPITAL_COMMUNITY): Payer: Self-pay | Admitting: *Deleted

## 2012-09-23 ENCOUNTER — Emergency Department (HOSPITAL_COMMUNITY)
Admission: EM | Admit: 2012-09-23 | Discharge: 2012-09-23 | Disposition: A | Discharge status: Home Health | Payer: Self-pay | Source: Home / Self Care | Attending: Family Medicine | Admitting: Family Medicine

## 2012-09-23 DIAGNOSIS — F411 Generalized anxiety disorder: Secondary | ICD-10-CM

## 2012-09-23 DIAGNOSIS — I10 Essential (primary) hypertension: Secondary | ICD-10-CM | POA: Diagnosis present

## 2012-09-23 DIAGNOSIS — R002 Palpitations: Secondary | ICD-10-CM | POA: Diagnosis present

## 2012-09-23 DIAGNOSIS — R42 Dizziness and giddiness: Secondary | ICD-10-CM | POA: Diagnosis present

## 2012-09-23 DIAGNOSIS — F419 Anxiety disorder, unspecified: Secondary | ICD-10-CM | POA: Diagnosis present

## 2012-09-23 DIAGNOSIS — R5381 Other malaise: Secondary | ICD-10-CM

## 2012-09-23 DIAGNOSIS — R5382 Chronic fatigue, unspecified: Secondary | ICD-10-CM

## 2012-09-23 LAB — COMPREHENSIVE METABOLIC PANEL
ALT: 7 U/L (ref 0–35)
Alkaline Phosphatase: 76 U/L (ref 39–117)
CO2: 25 mEq/L (ref 19–32)
GFR calc Af Amer: >90 mL/min (ref >90)
GFR calc non Af Amer: >90 mL/min (ref >90)
Glucose, Bld: 80 mg/dL (ref 70–99)
Potassium: 3.4 mEq/L — ABNORMAL LOW (ref 3.5–5.1)
Sodium: 136 mEq/L (ref 135–145)
Total Protein: 8.5 g/dL — ABNORMAL HIGH (ref 6.0–8.3)

## 2012-09-23 LAB — CBC
Hemoglobin: 11.5 g/dL — ABNORMAL LOW (ref 12.0–15.0)
RBC: 4.07 MIL/uL (ref 3.87–5.11)

## 2012-09-23 MED ORDER — LISINOPRIL-HYDROCHLOROTHIAZIDE 20-12.5 MG PO TABS: 1.0000 | ORAL_TABLET | Freq: Every day | ORAL | Status: DC

## 2012-09-23 NOTE — ED Provider Notes (Signed)
History     CSN: [id number]  Arrival date & time 09/23/12  1323   First MD Initiated Contact with Patient 09/23/12 1520      No chief complaint on file.   (Consider location/radiation/quality/duration/timing/severity/associated sxs/prior treatment) HPI Pt presenting for follow up for her hypertension.  She has been having dizziness episodes.  Pt says that she has had a couple of episodes in the last several days.  She denies CP, SOB.  She has been having some palpatations.  She says that these episodes have been intermittent.   Pt says that she has not been taking her celexa regularly.  She says that she forgets to take it.    History reviewed. No pertinent past medical history.  History reviewed. No pertinent past surgical history.  Family History  Problem Relation Age of Onset  . Asthma Mother   . Thyroid disease Sister   . Asthma Brother     History  Substance Use Topics  . Smoking status: Never Smoker   . Smokeless tobacco: Never Used  . Alcohol Use: 0.6 oz/week    1 Glasses of wine per week    OB History   Grav Para Term Preterm Abortions TAB SAB Ect Mult Living                  Review of Systems Constitutional: Negative.  HENT: Negative.  Respiratory: Negative.  Cardiovascular: Negative.  Gastrointestinal: Negative.  Endocrine: Negative.  Genitourinary: Negative.  Musculoskeletal: Negative.  Skin: Negative.  Allergic/Immunologic: Negative.  Neurological: Negative.  Hematological: Negative.  Psychiatric/Behavioral: Negative.  All other systems reviewed and are negative   Allergies  Review of patient's allergies indicates no known allergies.  Home Medications   Current Outpatient Rx  Name  Route  Sig  Dispense  Refill  . citalopram (CELEXA) 20 MG tablet   Oral   Take 1 tablet (20 mg total) by mouth daily.   30 tablet   3   . ferrous sulfate 325 (65 FE) MG tablet   Oral   Take 1 tablet (325 mg total) by mouth 3 (three) times daily with  meals.   90 tablet   3   . hydrochlorothiazide (HYDRODIURIL) 12.5 MG tablet   Oral   Take 1 tablet (12.5 mg total) by mouth daily.   30 tablet   3   . LORazepam (ATIVAN) 0.5 MG tablet   Oral   Take 1 tablet (0.5 mg total) by mouth at bedtime as needed for anxiety (insomnia).   30 tablet   0   . Multiple Vitamin (MULTIVITAMIN WITH MINERALS) TABS   Oral   Take 1 tablet by mouth daily.         . potassium chloride (K-DUR) 10 MEQ tablet   Oral   Take 1 tablet (10 mEq total) by mouth daily.   30 tablet   3     BP 131/89  Pulse 111  Temp(Src) 98 F (36.7 C) (Oral)  Resp 18  SpO2 100%  Physical Exam Nursing note and vitals reviewed.  Constitutional: She is oriented to person, place, and time. She appears well-developed and well-nourished. No distress.  HENT:  Head: Normocephalic and atraumatic.  Eyes: Conjunctivae and EOM are normal. Pupils are equal, round, and reactive to light.  Neck: Normal range of motion. Neck supple. No JVD present. No tracheal deviation present. No thyromegaly present.  Cardiovascular: Normal rate, regular rhythm and normal heart sounds.  Pulmonary/Chest: Effort normal and breath sounds normal. No respiratory  distress. She has no wheezes.  Abdominal: Soft. Bowel sounds are normal.  Musculoskeletal: Normal range of motion. She exhibits no edema and no tenderness.  Lymphadenopathy:  She has no cervical adenopathy.  Neurological: She is alert and oriented to person, place, and time. She has normal reflexes.  Skin: Skin is warm and dry.  Psychiatric: She has a normal mood and affect. Her behavior is normal. Judgment and thought content normal.   ED Course  Procedures (including critical care time)  Labs Reviewed - No data to display No results found.  No diagnosis found.  MDM  IMPRESSION  HTN, uncontrolled   Hyperlipidemia  Anxiety Disorder, uncontrolled  History of anemia  RECOMMENDATIONS / PLAN REfer pt to behavioral health for  eval of her anxiety disorder, currently uncontrolled,  Rechecked BP, EKG came back [facility], follow up with recheck Pt encouraged to take her celexa.    FOLLOW UP 2 weeks for BP check   The patient was given clear instructions to go to ER or return to medical center if symptoms don't improve, worsen or new problems develop.  The patient verbalized understanding.  The patient was told to call to get lab results if they haven't heard anything in the next week.           Cleora Fleet, MD 09/23/12 1610

## 2012-09-23 NOTE — ED Notes (Signed)
Presents today for new blood pressure medication, c/o of  Dizziness at intervals.  For blood draw today.

## 2012-09-25 NOTE — Progress Notes (Signed)
Quick Note:  Please inform patient that her potassium was a little low and her hemoglobin starting to drop again. She should take her iron tablets once per day. A potassium rich foods diet would be helpful. Other labs came back OK. Recheck labs in 3 months.   Rodney Langton, MD, CDE, FAAFP [facility] [place], [place]   ______

## 2012-09-27 ENCOUNTER — Telehealth (HOSPITAL_COMMUNITY): Payer: Self-pay

## 2012-09-27 NOTE — ED Notes (Signed)
Called patient to give results of labs Not available, left message

## 2012-09-28 ENCOUNTER — Telehealth (HOSPITAL_COMMUNITY): Payer: Self-pay

## 2012-09-28 NOTE — ED Notes (Signed)
Patient returned call lab results were given to her

## 2016-06-22 ENCOUNTER — Emergency Department
Admission: EM | Admit: 2016-06-22 | Discharge: 2016-06-22 | Disposition: A | Discharge status: Home / Self Care | Payer: Medicaid Other | Attending: Student in an Organized Health Care Education/Training Program | Admitting: Student in an Organized Health Care Education/Training Program

## 2016-06-22 DIAGNOSIS — I10 Essential (primary) hypertension: Secondary | ICD-10-CM | POA: Diagnosis not present

## 2016-06-22 DIAGNOSIS — R55 Syncope and collapse: Secondary | ICD-10-CM | POA: Diagnosis present

## 2016-06-22 DIAGNOSIS — D509 Iron deficiency anemia, unspecified: Secondary | ICD-10-CM | POA: Diagnosis not present

## 2016-06-22 DIAGNOSIS — Z79899 Other long term (current) drug therapy: Secondary | ICD-10-CM | POA: Diagnosis not present

## 2016-06-22 DIAGNOSIS — D649 Anemia, unspecified: Secondary | ICD-10-CM

## 2016-06-22 LAB — FERRITIN: Ferritin: 7 ng/mL — ABNORMAL LOW (ref 11–307)

## 2016-06-22 LAB — IRON AND TIBC
IRON: 49 ug/dL (ref 28–170)
Saturation Ratios: 9 % — ABNORMAL LOW (ref 10.4–31.8)
TIBC: 524 ug/dL — ABNORMAL HIGH (ref 250–450)
UIBC: 475 ug/dL

## 2016-06-22 LAB — DIFFERENTIAL
BASOS PCT: 1 %
Basophils Absolute: 0 10*3/uL (ref 0–0.1)
EOS PCT: 3 %
Eosinophils Absolute: 0.1 10*3/uL (ref 0–0.7)
LYMPHS PCT: 32 %
Lymphs Abs: 1.2 10*3/uL (ref 1.0–3.6)
MONO ABS: 0.4 10*3/uL (ref 0.2–0.9)
MONOS PCT: 12 %
NEUTROS ABS: 1.9 10*3/uL (ref 1.4–6.5)
Neutrophils Relative %: 52 %

## 2016-06-22 LAB — HCG, QUANTITATIVE, PREGNANCY: hCG, Beta Chain, Quant, S: <1 m[IU]/mL (ref <5)

## 2016-06-22 LAB — CBC
HCT: 23.3 % — ABNORMAL LOW (ref 35.0–47.0)
HEMOGLOBIN: 7.6 g/dL — AB (ref 12.0–16.0)
MCH: 25.7 pg — AB (ref 26.0–34.0)
MCHC: 32.8 g/dL (ref 32.0–36.0)
MCV: 78.5 fL — ABNORMAL LOW (ref 80.0–100.0)
PLATELETS: 344 10*3/uL (ref 150–440)
RBC: 2.96 MIL/uL — AB (ref 3.80–5.20)
RDW: 20 % — ABNORMAL HIGH (ref 11.5–14.5)
WBC: 3.7 10*3/uL (ref 3.6–11.0)

## 2016-06-22 LAB — BASIC METABOLIC PANEL
ANION GAP: 6 (ref 5–15)
BUN: 11 mg/dL (ref 6–20)
CHLORIDE: 105 mmol/L (ref 101–111)
CO2: 24 mmol/L (ref 22–32)
Calcium: 8.9 mg/dL (ref 8.9–10.3)
Creatinine, Ser: 0.63 mg/dL (ref 0.44–1.00)
GFR calc non Af Amer: >60 mL/min (ref >60)
Glucose, Bld: 103 mg/dL — ABNORMAL HIGH (ref 65–99)
Potassium: 3.6 mmol/L (ref 3.5–5.1)
SODIUM: 135 mmol/L (ref 135–145)

## 2016-06-22 LAB — URINALYSIS, COMPLETE (UACMP) WITH MICROSCOPIC
BILIRUBIN URINE: NEGATIVE
Bacteria, UA: NONE SEEN
Glucose, UA: NEGATIVE mg/dL
Hgb urine dipstick: NEGATIVE
KETONES UR: NEGATIVE mg/dL
LEUKOCYTES UA: NEGATIVE
Nitrite: NEGATIVE
PH: 5 (ref 5.0–8.0)
PROTEIN: NEGATIVE mg/dL
Specific Gravity, Urine: 1.021 (ref 1.005–1.030)

## 2016-06-22 LAB — RETICULOCYTES
RBC.: 2.94 MIL/uL — ABNORMAL LOW (ref 3.80–5.20)
RETIC COUNT ABSOLUTE: 67.6 10*3/uL (ref 19.0–183.0)
RETIC CT PCT: 2.3 % (ref 0.4–3.1)

## 2016-06-22 LAB — TYPE AND SCREEN
ABO/RH(D): B POS
Antibody Screen: NEGATIVE

## 2016-06-22 MED ORDER — SODIUM CHLORIDE 0.9 % IV BOLUS (SEPSIS)
500.0000 mL | Freq: Once | INTRAVENOUS | Status: AC
  Administered 2016-06-22: 500 mL via INTRAVENOUS

## 2016-06-22 MED ORDER — IRON DEXTRAN 50 MG/ML IJ SOLN
75.0000 mg | Freq: Once | INTRAMUSCULAR | Status: AC
  Administered 2016-06-22: 75 mg via INTRAVENOUS
  Filled 2016-06-22: qty 1.5

## 2016-06-22 MED ORDER — SODIUM CHLORIDE 0.9 % IV SOLN
25.0000 mg | Freq: Once | INTRAVENOUS | Status: DC
  Filled 2016-06-22: qty 0.5

## 2016-06-22 MED ORDER — POLYETHYLENE GLYCOL 3350 17 G PO PACK: 17.0000 g | PACK | Freq: Every day | ORAL | 0 refills | Status: DC

## 2016-06-22 MED ORDER — IRON DEXTRAN 50 MG/ML IJ SOLN
100.0000 mg | Freq: Once | INTRAMUSCULAR | Status: DC
  Filled 2016-06-22: qty 2

## 2016-06-22 MED ORDER — PROMETHAZINE HCL 12.5 MG PO TABS: 12.5000 mg | ORAL_TABLET | Freq: Four times a day (QID) | ORAL | 0 refills | Status: DC | PRN

## 2016-06-22 MED ORDER — SODIUM CHLORIDE 0.9 % IV SOLN
25.0000 mg | Freq: Once | INTRAVENOUS | Status: AC
  Administered 2016-06-22: 25 mg via INTRAVENOUS
  Filled 2016-06-22: qty 0.5

## 2016-06-22 MED ORDER — IRON 325 (65 FE) MG PO TABS: 1.0000 | ORAL_TABLET | Freq: Three times a day (TID) | ORAL | 0 refills | Status: AC

## 2016-06-22 NOTE — ED Notes (Signed)
Pt to ed with c/o fainting on Thursday.  Pt states she was asleep and got up to go to the bathroom,  Pt then states that she awoke laying in the floor.  Per family she was out for about 3 minutes before she could wake up and understand circumstances.  Pt alert and oriented at this time. Skin warm and dry,  Pt is mildly pale.  Pt states since the episode of Thursday she has been weak, decreased appetite, decreased energy and lethargy.  Pt resting in bed at this time.

## 2016-06-22 NOTE — ED Notes (Signed)
Per Dr Quentin Cornwall - he spoke with pharmacist and they agreed to run iron dextran complex at 984ml/hr

## 2016-06-22 NOTE — ED Provider Notes (Signed)
[facility] Emergency Department Provider Note    First MD Initiated Contact with Patient 06/22/16 1343     (approximate)  I have reviewed the triage vital signs and the nursing notes.   HISTORY  Chief Complaint Near Syncope    HPI Kaitlin Harris is a 46 y.o. female who presents with chief complaint of generalized weakness and decreased energy after she fainted on Thursday. Patient states she was walking the bathroom on Thursday night. After coming back to the bed she began to feel lightheaded and slumped to the ground. Denies any full LOC. No head injury. States since then she's felt weak and fatigued. Says she has a remote history of iron deficiency anemia. Has not seen a doctor in over 4 years. Denies any history of bleeding disorders. She is not on any blood thinners. States her last cycle was 2 weeks ago and was normal. She denies any melena or hematochezia. Denies any epigastric pain. No history of ulcer or gastritis.   History reviewed. No pertinent past medical history. Family History  Problem Relation Age of Onset  . Asthma Mother   . Thyroid disease Sister   . Asthma Brother    History reviewed. No pertinent surgical history. Patient Active Problem List   Diagnosis Date Noted  . Hypertension 09/23/2012  . Palpitations 09/23/2012  . Dizziness 09/23/2012  . Anxiety disorder 09/23/2012  . Chronic fatigue 09/23/2012      Prior to Admission medications   Medication Sig Start Date End Date Taking? Authorizing Provider  citalopram (CELEXA) 20 MG tablet Take 1 tablet (20 mg total) by mouth daily. 06/08/12   Clanford Marisa Hua, MD  Ferrous Sulfate (IRON) 325 (65 Fe) MG TABS Take 1 tablet by mouth 3 (three) times daily. Take three times daily every other day 06/22/16   Merlyn Lot, MD  ferrous sulfate 325 (65 FE) MG tablet Take 1 tablet (325 mg total) by mouth 3 (three) times daily with meals. 03/14/12   John-Adam Bonk, MD    lisinopril-hydrochlorothiazide (ZESTORETIC) 20-12.5 MG per tablet Take 1 tablet by mouth daily. 09/23/12   Clanford Marisa Hua, MD  LORazepam (ATIVAN) 0.5 MG tablet Take 1 tablet (0.5 mg total) by mouth at bedtime as needed for anxiety (insomnia). 06/08/12   Clanford Marisa Hua, MD  Multiple Vitamin (MULTIVITAMIN WITH MINERALS) TABS Take 1 tablet by mouth daily.    Historical Provider, MD  polyethylene glycol (MIRALAX / GLYCOLAX) packet Take 17 g by mouth daily. Mix one tablespoon with 8oz of your favorite juice or water every day until you are having soft formed stools. Then start taking once daily if you didn't have a stool the day before. 06/22/16   Merlyn Lot, MD  potassium chloride (K-DUR) 10 MEQ tablet Take 1 tablet (10 mEq total) by mouth daily. 06/08/12   Clanford Marisa Hua, MD  promethazine (PHENERGAN) 12.5 MG tablet Take 1 tablet (12.5 mg total) by mouth every 6 (six) hours as needed for nausea or vomiting. 06/22/16   Merlyn Lot, MD    Allergies Patient has no known allergies.    Social History Social History  Substance Use Topics  . Smoking status: Never Smoker  . Smokeless tobacco: Never Used  . Alcohol use No    Review of Systems Patient denies headaches, rhinorrhea, blurry vision, numbness, shortness of breath, chest pain, edema, cough, abdominal pain, nausea, vomiting, diarrhea, dysuria, fevers, rashes or hallucinations unless otherwise stated above in HPI. ____________________________________________   PHYSICAL EXAM:  VITAL SIGNS: Vitals:   06/22/16 1900 06/22/16 1930  BP: 134/78 (!) 149/88  Pulse: 94 (!) 106  Resp:    Temp:      Constitutional: Alert and oriented. Pale appearing but in no acute distress. Eyes: Conjunctivae are pale. PERRL. EOMI. Head: Atraumatic. Nose: No congestion/rhinnorhea. Mouth/Throat: Mucous membranes are moist.  Oropharynx non-erythematous. Neck: No stridor. Painless ROM. No cervical spine tenderness to  palpation Hematological/Lymphatic/Immunilogical: No cervical lymphadenopathy. Cardiovascular: tachycardic rate, regular rhythm. Grossly normal heart sounds.  Good peripheral circulation. Respiratory: Normal respiratory effort.  No retractions. Lungs CTAB. Gastrointestinal: Soft and nontender. No distention. No abdominal bruits. No CVA tenderness. Musculoskeletal: No lower extremity tenderness nor edema.  No joint effusions. Neurologic:  Normal speech and language. No gross focal neurologic deficits are appreciated. No gait instability. Skin:  Skin is warm, dry and intact. No rash noted. Psychiatric: Mood and affect are normal. Speech and behavior are normal.  ____________________________________________   LABS (all labs ordered are listed, but only abnormal results are displayed)  Results for orders placed or performed during the hospital encounter of 06/22/16 (from the past 24 hour(s))  Basic metabolic panel     Status: Abnormal   Collection Time: 06/22/16 11:28 AM  Result Value Ref Range   Sodium 135 135 - 145 mmol/L   Potassium 3.6 3.5 - 5.1 mmol/L   Chloride 105 101 - 111 mmol/L   CO2 24 22 - 32 mmol/L   Glucose, Bld 103 (H) 65 - 99 mg/dL   BUN 11 6 - 20 mg/dL   Creatinine, Ser 0.63 0.44 - 1.00 mg/dL   Calcium 8.9 8.9 - 10.3 mg/dL   GFR calc non Af Amer >60 >60 mL/min   GFR calc Af Amer >60 >60 mL/min   Anion gap 6 5 - 15  CBC     Status: Abnormal   Collection Time: 06/22/16 11:28 AM  Result Value Ref Range   WBC 3.7 3.6 - 11.0 K/uL   RBC 2.96 (L) 3.80 - 5.20 MIL/uL   Hemoglobin 7.6 (L) 12.0 - 16.0 g/dL   HCT 23.3 (L) 35.0 - 47.0 %   MCV 78.5 (L) 80.0 - 100.0 fL   MCH 25.7 (L) 26.0 - 34.0 pg   MCHC 32.8 32.0 - 36.0 g/dL   RDW 20.0 (H) 11.5 - 14.5 %   Platelets 344 150 - 440 K/uL  Urinalysis, Complete w Microscopic     Status: Abnormal   Collection Time: 06/22/16 11:28 AM  Result Value Ref Range   Color, Urine AMBER (A) YELLOW   APPearance HAZY (A) CLEAR   Specific  Gravity, Urine 1.021 1.005 - 1.030   pH 5.0 5.0 - 8.0   Glucose, UA NEGATIVE NEGATIVE mg/dL   Hgb urine dipstick NEGATIVE NEGATIVE   Bilirubin Urine NEGATIVE NEGATIVE   Ketones, ur NEGATIVE NEGATIVE mg/dL   Protein, ur NEGATIVE NEGATIVE mg/dL   Nitrite NEGATIVE NEGATIVE   Leukocytes, UA NEGATIVE NEGATIVE   RBC / HPF 0-5 0 - 5 RBC/hpf   WBC, UA 0-5 0 - 5 WBC/hpf   Bacteria, UA NONE SEEN NONE SEEN   Squamous Epithelial / LPF 0-5 (A) NONE SEEN   Mucous PRESENT   Ferritin (Iron Binding Protein)     Status: Abnormal   Collection Time: 06/22/16 11:28 AM  Result Value Ref Range   Ferritin 7 (L) 11 - 307 ng/mL  Differential     Status: None   Collection Time: 06/22/16 11:28 AM  Result Value Ref Range  Neutrophils Relative % 52 %   Neutro Abs 1.9 1.4 - 6.5 K/uL   Lymphocytes Relative 32 %   Lymphs Abs 1.2 1.0 - 3.6 K/uL   Monocytes Relative 12 %   Monocytes Absolute 0.4 0.2 - 0.9 K/uL   Eosinophils Relative 3 %   Eosinophils Absolute 0.1 0 - 0.7 K/uL   Basophils Relative 1 %   Basophils Absolute 0.0 0 - 0.1 K/uL  Reticulocytes     Status: Abnormal   Collection Time: 06/22/16 11:31 AM  Result Value Ref Range   Retic Ct Pct 2.3 0.4 - 3.1 %   RBC. 2.94 (L) 3.80 - 5.20 MIL/uL   Retic Count, Manual 67.6 19.0 - 183.0 K/uL  Type and screen  [facility]     Status: None   Collection Time: 06/22/16 12:10 PM  Result Value Ref Range   ABO/RH(D) B POS    Antibody Screen NEG    Sample Expiration 06/25/2016   Iron and TIBC     Status: Abnormal   Collection Time: 06/22/16  2:27 PM  Result Value Ref Range   Iron 49 28 - 170 ug/dL   TIBC 524 (H) 250 - 450 ug/dL   Saturation Ratios 9 (L) 10.4 - 31.8 %   UIBC 475 ug/dL  hCG, quantitative, pregnancy     Status: None   Collection Time: 06/22/16  2:27 PM  Result Value Ref Range   hCG, Beta Chain, Quant, S <1 <5 mIU/mL   ____________________________________________  EKG My review and personal interpretation at Time:  11:29   Indication: syncope  Rate: 105  Rhythm: sinus Axis: normal Other: non specific st changes, no acute STEMI ____________________________________________  RADIOLOGY   ____________________________________________   PROCEDURES  Procedure(s) performed:  Procedures    Critical Care performed: no ____________________________________________   INITIAL IMPRESSION / ASSESSMENT AND PLAN / ED COURSE  Pertinent labs & imaging results that were available during my care of the patient were reviewed by me and considered in my medical decision making (see chart for details).  DDX: IDA, abla, gi bleed, aplastic anemia, dysrhythmia, dehydration  KYNLEY MISHER is a 46 y.o. who presents to the ED with generalized weakness and near syncopal event on Thursday. She is afebrile, mildly tachycardic but well perfused. She does appear pale and fatigued but nontoxic. Blood work ordered to evaluate for dehydration or anemia shows evidence of mircositic anemia.  No leukopenia orthopnea cytopenia to suggest aplastic crisis. Patient denies any melena or hematochezia but we will check rectal exam. Denies abdominal pain. No history of AAA. Patient also young side have a very low suspicion for that. EKG shows no dysrhythmia. Presentation most likely secondary to iron deficiency anemia. We'll check iron levels and provide IV iron.  The patient will be placed on continuous pulse oximetry and telemetry for monitoring.  Laboratory evaluation will be sent to evaluate for the above complaints.     Clinical Course as of Jun 22 2046  Mon Jun 22, 2016  1659 Rectal guaiac is negative.  Patient feels some improvement after IV iron and bolus.  Will continue to monitor.  Will ambulate patient.  [PR]    Clinical Course User Index [PR] Merlyn Lot, MD   Patient was able to ambulate with steady gait. She is tolerating oral hydration. Her heart rate is well-controlled and current heart rate is 90 bpm. She is  able to ambulate with steady gait without any lightheadedness. No shortness of breath. Presentation consistent with iron  deficiency anemia. I prescribed iron supplements and follow with PCP as well as hematology as needed.  Have discussed with the patient and available family all diagnostics and treatments performed thus far and all questions were answered to the best of my ability. The patient demonstrates understanding and agreement with plan.   ____________________________________________   FINAL CLINICAL IMPRESSION(S) / ED DIAGNOSES  Final diagnoses:  Symptomatic anemia  Iron deficiency anemia, unspecified iron deficiency anemia type      NEW MEDICATIONS STARTED DURING THIS VISIT:  Discharge Medication List as of 06/22/2016  7:46 PM    START taking these medications   Details  !! Ferrous Sulfate (IRON) 325 (65 Fe) MG TABS Take 1 tablet by mouth 3 (three) times daily. Take three times daily every other day, Starting Mon 06/22/2016, Print    polyethylene glycol (MIRALAX / GLYCOLAX) packet Take 17 g by mouth daily. Mix one tablespoon with 8oz of your favorite juice or water every day until you are having soft formed stools. Then start taking once daily if you didn't have a stool the day before., Starting Mon 06/22/2016, Print    promethazine (PHENERGAN) 12.5 MG tablet Take 1 tablet (12.5 mg total) by mouth every 6 (six) hours as needed for nausea or vomiting., Starting Mon 06/22/2016, Print     !! - Potential duplicate medications found. Please discuss with provider.       Note:  This document was prepared using Dragon voice recognition software and may include unintentional dictation errors.    Merlyn Lot, MD 06/22/16 2049

## 2016-06-22 NOTE — ED Notes (Signed)
Pt finished meal Lm edt

## 2016-06-22 NOTE — ED Triage Notes (Signed)
Pt reports that Thursday night she "fainted" and since then has been feeling weak and decreased energy and decreased appetite

## 2016-06-22 NOTE — ED Notes (Signed)
Pharmacy called to send iron dextran complex injection

## 2016-07-07 ENCOUNTER — Inpatient Hospital Stay: Payer: Medicaid Other | Admitting: Hematology and Oncology

## 2016-07-07 NOTE — Progress Notes (Deleted)
[facility] day:  07/07/2016  Chief Complaint: Kaitlin Harris is a 46 y.o. female  With iron deficiency anemia who is referred by Dr. Merlyn Lot for assessment and management.  HPI:  The patient has a history of iron deficiency anemia. Until recently she had not seen a physician in over 4 years.  The patient presented to the [facility] emergency room on 06/22/2016 with a near syncopal episode.  She described being generally weak and fatigued.  She denied any bleeding.  She received IV iron (InFed 75 mg) and fluid.  She was discharged on ferrous sulfate 325 mg po TID.Marland Kitchen  CBC on 06/22/2016 revealed a hematocrit 23.3, hemoglobin 7.6, MCV 78.5, platelets 344,000, white count 3700 with a differnetial. Creatinine was 0.63. Ferritin was 7.  Iron saturation was 9% with a TIBC of 524. Reticulocyte count was 2.3%.   No past medical history on file.  No past surgical history on file.  Family History  Problem Relation Age of Onset  . Asthma Mother   . Thyroid disease Sister   . Asthma Brother     Social History:  reports that she has never smoked. She has never used smokeless tobacco. She reports that she does not drink alcohol or use drugs.  The patient is accompanied by *** alone today.  Allergies: No Known Allergies  Current Medications: Current Outpatient Prescriptions  Medication Sig Dispense Refill  . citalopram (CELEXA) 20 MG tablet Take 1 tablet (20 mg total) by mouth daily. 30 tablet 3  . Ferrous Sulfate (IRON) 325 (65 Fe) MG TABS Take 1 tablet by mouth 3 (three) times daily. Take three times daily every other day 60 each 0  . ferrous sulfate 325 (65 FE) MG tablet Take 1 tablet (325 mg total) by mouth 3 (three) times daily with meals. 90 tablet 3  . lisinopril-hydrochlorothiazide (ZESTORETIC) 20-12.5 MG per tablet Take 1 tablet by mouth daily. 30 tablet 3  . LORazepam (ATIVAN) 0.5 MG tablet Take 1 tablet (0.5 mg total) by mouth at bedtime  as needed for anxiety (insomnia). 30 tablet 0  . Multiple Vitamin (MULTIVITAMIN WITH MINERALS) TABS Take 1 tablet by mouth daily.    . polyethylene glycol (MIRALAX / GLYCOLAX) packet Take 17 g by mouth daily. Mix one tablespoon with 8oz of your favorite juice or water every day until you are having soft formed stools. Then start taking once daily if you didn't have a stool the day before. 30 each 0  . potassium chloride (K-DUR) 10 MEQ tablet Take 1 tablet (10 mEq total) by mouth daily. 30 tablet 3  . promethazine (PHENERGAN) 12.5 MG tablet Take 1 tablet (12.5 mg total) by mouth every 6 (six) hours as needed for nausea or vomiting. 12 tablet 0   No current facility-administered medications for this visit.     Review of Systems:  GENERAL:  Feels good.  Active.  No fevers, sweats or weight loss. PERFORMANCE STATUS (ECOG):  *** HEENT:  No visual changes, runny nose, sore throat, mouth sores or tenderness. Lungs: No shortness of breath or cough.  No hemoptysis. Cardiac:  No chest pain, palpitations, orthopnea, or PND. GI:  No nausea, vomiting, diarrhea, constipation, melena or hematochezia. GU:  No urgency, frequency, dysuria, or hematuria. Musculoskeletal:  No back pain.  No joint pain.  No muscle tenderness. Extremities:  No pain or swelling. Skin:  No rashes or skin changes. Neuro:  No headache, numbness or weakness, balance or coordination  issues. Endocrine:  No diabetes, thyroid issues, hot flashes or night sweats. Psych:  No mood changes, depression or anxiety. Pain:  No focal pain. Review of systems:  All other systems reviewed and found to be negative.   Physical Exam: Last menstrual period 06/15/2016. GENERAL:  Well developed, well nourished, sitting comfortably in the exam room in no acute distress. MENTAL STATUS:  Alert and oriented to person, place and time. HEAD:  *** hair.  Normocephalic, atraumatic, face symmetric, no Cushingoid features. EYES:  *** eyes.  Pupils equal round  and reactive to light and accomodation.  No conjunctivitis or scleral icterus. ENT:  Oropharynx clear without lesion.  Tongue normal. Mucous membranes moist.  RESPIRATORY:  Clear to auscultation without rales, wheezes or rhonchi. CARDIOVASCULAR:  Regular rate and rhythm without murmur, rub or gallop. ABDOMEN:  Soft, non-tender, with active bowel sounds, and no hepatosplenomegaly.  No masses. SKIN:  No rashes, ulcers or lesions. EXTREMITIES: No edema, no skin discoloration or tenderness.  No palpable cords. LYMPH NODES: No palpable cervical, supraclavicular, axillary or inguinal adenopathy  NEUROLOGICAL: Unremarkable. PSYCH:  Appropriate.  No visits with results within 3 Day(s) from this visit.  Latest known visit with results is:  Admission on 06/22/2016, Discharged on 06/22/2016  Component Date Value Ref Range Status  . Sodium 06/22/2016 135  135 - 145 mmol/L Final  . Potassium 06/22/2016 3.6  3.5 - 5.1 mmol/L Final  . Chloride 06/22/2016 105  101 - 111 mmol/L Final  . CO2 06/22/2016 24  22 - 32 mmol/L Final  . Glucose, Bld 06/22/2016 103* 65 - 99 mg/dL Final  . BUN 06/22/2016 11  6 - 20 mg/dL Final  . Creatinine, Ser 06/22/2016 0.63  0.44 - 1.00 mg/dL Final  . Calcium 06/22/2016 8.9  8.9 - 10.3 mg/dL Final  . GFR calc non Af Amer 06/22/2016 >60  >60 mL/min Final  . GFR calc Af Amer 06/22/2016 >60  >60 mL/min Final   Comment: (NOTE) The eGFR has been calculated using the CKD EPI equation. This calculation has not been validated in all clinical situations. eGFR's persistently <60 mL/min signify possible Chronic Kidney Disease.   . Anion gap 06/22/2016 6  5 - 15 Final  . WBC 06/22/2016 3.7  3.6 - 11.0 K/uL Final  . RBC 06/22/2016 2.96* 3.80 - 5.20 MIL/uL Final  . Hemoglobin 06/22/2016 7.6* 12.0 - 16.0 g/dL Final  . HCT 06/22/2016 23.3* 35.0 - 47.0 % Final  . MCV 06/22/2016 78.5* 80.0 - 100.0 fL Final  . MCH 06/22/2016 25.7* 26.0 - 34.0 pg Final  . MCHC 06/22/2016 32.8  32.0 -  36.0 g/dL Final  . RDW 06/22/2016 20.0* 11.5 - 14.5 % Final  . Platelets 06/22/2016 344  150 - 440 K/uL Final  . Color, Urine 06/22/2016 AMBER* YELLOW Final  . APPearance 06/22/2016 HAZY* CLEAR Final  . Specific Gravity, Urine 06/22/2016 1.021  1.005 - 1.030 Final  . pH 06/22/2016 5.0  5.0 - 8.0 Final  . Glucose, UA 06/22/2016 NEGATIVE  NEGATIVE mg/dL Final  . Hgb urine dipstick 06/22/2016 NEGATIVE  NEGATIVE Final  . Bilirubin Urine 06/22/2016 NEGATIVE  NEGATIVE Final  . Ketones, ur 06/22/2016 NEGATIVE  NEGATIVE mg/dL Final  . Protein, ur 06/22/2016 NEGATIVE  NEGATIVE mg/dL Final  . Nitrite 06/22/2016 NEGATIVE  NEGATIVE Final  . Leukocytes, UA 06/22/2016 NEGATIVE  NEGATIVE Final  . RBC / HPF 06/22/2016 0-5  0 - 5 RBC/hpf Final  . WBC, UA 06/22/2016 0-5  0 - 5  WBC/hpf Final  . Bacteria, UA 06/22/2016 NONE SEEN  NONE SEEN Final  . Squamous Epithelial / LPF 06/22/2016 0-5* NONE SEEN Final  . Mucous 06/22/2016 PRESENT   Final  . ABO/RH(D) 06/22/2016 B POS   Final  . Antibody Screen 06/22/2016 NEG   Final  . Sample Expiration 06/22/2016 06/25/2016   Final  . Iron 06/22/2016 49  28 - 170 ug/dL Final  . TIBC 06/22/2016 524* 250 - 450 ug/dL Final  . Saturation Ratios 06/22/2016 9* 10.4 - 31.8 % Final  . UIBC 06/22/2016 475  ug/dL Final  . hCG, Beta Chain, Quant, S 06/22/2016 <1  <5 mIU/mL Final   Comment:          GEST. AGE      CONC.  (mIU/mL)   <=1 WEEK        5 - 50     2 WEEKS       50 - 500     3 WEEKS       100 - 10,000     4 WEEKS     1,000 - 30,000     5 WEEKS     3,500 - 115,000   6-8 WEEKS     12,000 - 270,000    12 WEEKS     15,000 - 220,000        FEMALE AND NON-PREGNANT FEMALE:     LESS THAN 5 mIU/mL   . Retic Ct Pct 06/22/2016 2.3  0.4 - 3.1 % Final  . RBC. 06/22/2016 2.94* 3.80 - 5.20 MIL/uL Final  . Retic Count, Manual 06/22/2016 67.6  19.0 - 183.0 K/uL Final  . Ferritin 06/22/2016 7* 11 - 307 ng/mL Final  . Neutrophils Relative % 06/22/2016 52  % Final  . Neutro  Abs 06/22/2016 1.9  1.4 - 6.5 K/uL Final  . Lymphocytes Relative 06/22/2016 32  % Final  . Lymphs Abs 06/22/2016 1.2  1.0 - 3.6 K/uL Final  . Monocytes Relative 06/22/2016 12  % Final  . Monocytes Absolute 06/22/2016 0.4  0.2 - 0.9 K/uL Final  . Eosinophils Relative 06/22/2016 3  % Final  . Eosinophils Absolute 06/22/2016 0.1  0 - 0.7 K/uL Final  . Basophils Relative 06/22/2016 1  % Final  . Basophils Absolute 06/22/2016 0.0  0 - 0.1 K/uL Final    Assessment:  AJAYLA IGLESIAS is a 46 y.o. female ***  Plan: 1. *** 2. *** 3. *** 4. *** 5. ***  Lequita Asal, MD  07/07/2016, 5:46 AM

## 2016-10-22 ENCOUNTER — Observation Stay
Admission: AD | Admit: 2016-10-22 | Payer: Medicaid Other | Source: Ambulatory Visit | Admitting: Obstetrics and Gynecology

## 2016-10-22 ENCOUNTER — Other Ambulatory Visit: Payer: Self-pay | Admitting: Obstetrics and Gynecology

## 2016-10-23 ENCOUNTER — Ambulatory Visit
Admission: RE | Admit: 2016-10-23 | Discharge: 2016-10-23 | Disposition: A | Discharge status: Home / Self Care | Payer: Medicaid Other | Source: Ambulatory Visit | Attending: Obstetrics and Gynecology | Admitting: Obstetrics and Gynecology

## 2016-10-23 DIAGNOSIS — D649 Anemia, unspecified: Secondary | ICD-10-CM | POA: Diagnosis not present

## 2016-10-23 LAB — CBC
HCT: 22.2 % — ABNORMAL LOW (ref 35.0–47.0)
HEMOGLOBIN: 7 g/dL — AB (ref 12.0–16.0)
MCH: 24 pg — ABNORMAL LOW (ref 26.0–34.0)
MCHC: 31.5 g/dL — AB (ref 32.0–36.0)
MCV: 76.2 fL — ABNORMAL LOW (ref 80.0–100.0)
Platelets: 265 10*3/uL (ref 150–440)
RBC: 2.91 MIL/uL — ABNORMAL LOW (ref 3.80–5.20)
RDW: 19.8 % — AB (ref 11.5–14.5)
WBC: 3.4 10*3/uL — AB (ref 3.6–11.0)

## 2016-10-23 LAB — HEMOGLOBIN AND HEMATOCRIT, BLOOD
HEMATOCRIT: 30.3 % — AB (ref 35.0–47.0)
HEMOGLOBIN: 10.1 g/dL — AB (ref 12.0–16.0)

## 2016-10-23 LAB — PREPARE RBC (CROSSMATCH)

## 2016-10-23 MED ORDER — DIPHENHYDRAMINE HCL 25 MG PO CAPS
ORAL_CAPSULE | ORAL | Status: AC
  Administered 2016-10-23: 25 mg via ORAL
  Filled 2016-10-23: qty 1

## 2016-10-23 MED ORDER — DIPHENHYDRAMINE HCL 25 MG PO CAPS
25.0000 mg | ORAL_CAPSULE | Freq: Once | ORAL | Status: AC
  Administered 2016-10-23: 25 mg via ORAL

## 2016-10-23 MED ORDER — ACETAMINOPHEN 325 MG PO TABS
650.0000 mg | ORAL_TABLET | Freq: Once | ORAL | Status: AC
  Administered 2016-10-23: 650 mg via ORAL

## 2016-10-23 MED ORDER — TRANEXAMIC ACID 650 MG PO TABS: 1300.0000 mg | ORAL_TABLET | Freq: Three times a day (TID) | ORAL | Status: DC

## 2016-10-23 MED ORDER — SODIUM CHLORIDE FLUSH 0.9 % IV SOLN
INTRAVENOUS | Status: AC
  Administered 2016-10-23: 09:00:00 via INTRAVENOUS
  Filled 2016-10-23: qty 10

## 2016-10-23 MED ORDER — ACETAMINOPHEN 325 MG PO TABS
ORAL_TABLET | ORAL | Status: AC
  Administered 2016-10-23: 650 mg via ORAL
  Filled 2016-10-23: qty 2

## 2016-10-23 MED ORDER — TRANEXAMIC ACID 650 MG PO TABS
1300.0000 mg | ORAL_TABLET | Freq: Three times a day (TID) | ORAL | Status: DC
  Filled 2016-10-23 (×3): qty 2

## 2016-10-23 MED ORDER — SODIUM CHLORIDE 0.9 % IV SOLN
Freq: Once | INTRAVENOUS | Status: AC
  Administered 2016-10-23: 10:00:00 via INTRAVENOUS

## 2016-10-23 NOTE — OR Nursing (Addendum)
Patient complains of new nausea since blood transfusion complete. Given gingerale and crackers. Lab here to draw repeat hgb and hct. Patient states that a prescription for PO TXA was called into her pharmacy yesterday. Discharged home, ambulatory and assisted by her son.  Reviewed signs and symptoms of a reaction to blood transfusion. Attempted to contact Dr. Leonides Schanz to confirm prescription sent to pharmacy but as stated, patient said it was called in.  Nausea subsiding since taking in fluids and crackers.

## 2016-10-23 NOTE — Discharge Instructions (Signed)
Blood Transfusion , Adult A blood transfusion is a procedure in which you receive donated blood, including plasma, platelets, and red blood cells, through an IV tube. You may need a blood transfusion because of illness, surgery, or injury. The blood may come from a donor. You may also be able to donate blood for yourself (autologous blood donation) before a surgery if you know that you might require a blood transfusion. The blood given in a transfusion is made up of different types of cells. You may receive:  Red blood cells. These carry oxygen to the cells in the body.  White blood cells. These help you fight infections.  Platelets. These help your blood to clot.  Plasma. This is the liquid part of your blood and it helps with fluid imbalances. If you have hemophilia or another clotting disorder, you may also receive other types of blood products. Tell a health care provider about:  Any allergies you have.  All medicines you are taking, including vitamins, herbs, eye drops, creams, and over-the-counter medicines.  Any problems you or family members have had with anesthetic medicines.  Any blood disorders you have.  Any surgeries you have had.  Any medical conditions you have, including any recent fever or cold symptoms.  Whether you are pregnant or may be pregnant.  Any previous reactions you have had during a blood transfusion. What are the risks? Generally, this is a safe procedure. However, problems may occur, including:  Having an allergic reaction to something in the donated blood. Hives and itching may be symptoms of this type of reaction.  Fever. This may be a reaction to the white blood cells in the transfused blood. Nausea or chest pain may accompany a fever.  Iron overload. This can happen from having many transfusions.  Transfusion-related acute lung injury (TRALI). This is a rare reaction that causes lung damage. The cause is not known.TRALI can occur within hours  of a transfusion or several days later.  Sudden (acute) or delayed hemolytic reactions. This happens if your blood does not match the cells in your transfusion. Your body's defense system (immune system) may try to attack the new cells. This complication is rare. The symptoms include fever, chills, nausea, and low back pain or chest pain.  Infection or disease transmission. This is rare. What happens before the procedure?  You will have a blood test to determine your blood type. This is necessary to know what kind of blood your body will accept and to match it to the donor blood.  If you are going to have a planned surgery, you may be able to do an autologous blood donation. This may be done in case you need to have a transfusion.  If you have had an allergic reaction to a transfusion in the past, you may be given medicine to help prevent a reaction. This medicine may be given to you by mouth or through an IV tube.  You will have your temperature, blood pressure, and pulse monitored before the transfusion.  Follow instructions from your health care provider about eating and drinking restrictions.  Ask your health care provider about:  Changing or stopping your regular medicines. This is especially important if you are taking diabetes medicines or blood thinners.  Taking medicines such as aspirin and ibuprofen. These medicines can thin your blood. Do not take these medicines before your procedure if your health care provider instructs you not to. What happens during the procedure?  An IV tube will be   inserted into one of your veins.  The bag of donated blood will be attached to your IV tube. The blood will then enter through your vein.  Your temperature, blood pressure, and pulse will be monitored regularly during the transfusion. This monitoring is done to detect early signs of a transfusion reaction.  If you have any signs or symptoms of a reaction, your transfusion will be stopped and  you may be given medicine.  When the transfusion is complete, your IV tube will be removed.  Pressure may be applied to the IV site for a few minutes.  A bandage (dressing) will be applied. The procedure may vary among health care providers and hospitals. What happens after the procedure?  Your temperature, blood pressure, heart rate, breathing rate, and blood oxygen level will be monitored often.  Your blood may be tested to see how you are responding to the transfusion.  You may be warmed with fluids or blankets to maintain a normal body temperature. Summary  A blood transfusion is a procedure in which you receive donated blood, including plasma, platelets, and red blood cells, through an IV tube.  Your temperature, blood pressure, and pulse will be monitored before, during, and after the transfusion.  Your blood may be tested after the transfusion to see how your body has responded. This information is not intended to replace advice given to you by your health care provider. Make sure you discuss any questions you have with your health care provider. Document Released: 05/15/2000 Document Revised: 02/13/2016 Document Reviewed: 02/13/2016 Elsevier Interactive Patient Education  2017 [employer]  

## 2016-10-24 LAB — TYPE AND SCREEN
ABO/RH(D): B POS
Antibody Screen: NEGATIVE
Unit division: 0
Unit division: 0

## 2016-10-24 LAB — BPAM RBC
Blood Product Expiration Date: 201806212359
Blood Product Expiration Date: 201806232359
ISSUE DATE / TIME: 201805250928
ISSUE DATE / TIME: 201805251100
Unit Type and Rh: 7300
Unit Type and Rh: 7300

## 2016-12-05 ENCOUNTER — Emergency Department
Admission: EM | Admit: 2016-12-05 | Discharge: 2016-12-05 | Disposition: A | Discharge status: Home / Self Care | Payer: Medicaid Other | Attending: Emergency Medicine | Admitting: Emergency Medicine

## 2016-12-05 ENCOUNTER — Encounter: Payer: Self-pay | Admitting: Emergency Medicine

## 2016-12-05 DIAGNOSIS — R531 Weakness: Secondary | ICD-10-CM | POA: Insufficient documentation

## 2016-12-05 DIAGNOSIS — R5383 Other fatigue: Secondary | ICD-10-CM | POA: Insufficient documentation

## 2016-12-05 DIAGNOSIS — D500 Iron deficiency anemia secondary to blood loss (chronic): Secondary | ICD-10-CM

## 2016-12-05 DIAGNOSIS — Z8742 Personal history of other diseases of the female genital tract: Secondary | ICD-10-CM | POA: Insufficient documentation

## 2016-12-05 DIAGNOSIS — I10 Essential (primary) hypertension: Secondary | ICD-10-CM | POA: Insufficient documentation

## 2016-12-05 DIAGNOSIS — Z79899 Other long term (current) drug therapy: Secondary | ICD-10-CM | POA: Insufficient documentation

## 2016-12-05 HISTORY — DX: Benign neoplasm of connective and other soft tissue, unspecified: D21.9

## 2016-12-05 HISTORY — DX: Anemia, unspecified: D64.9

## 2016-12-05 LAB — CBC
HEMATOCRIT: 24.6 % — AB (ref 35.0–47.0)
Hemoglobin: 7.9 g/dL — ABNORMAL LOW (ref 12.0–16.0)
MCH: 25.2 pg — ABNORMAL LOW (ref 26.0–34.0)
MCHC: 32.1 g/dL (ref 32.0–36.0)
MCV: 78.5 fL — AB (ref 80.0–100.0)
PLATELETS: 235 10*3/uL (ref 150–440)
RBC: 3.13 MIL/uL — ABNORMAL LOW (ref 3.80–5.20)
RDW: 20.7 % — AB (ref 11.5–14.5)
WBC: 5.5 10*3/uL (ref 3.6–11.0)

## 2016-12-05 LAB — BASIC METABOLIC PANEL
Anion gap: 7 (ref 5–15)
BUN: 17 mg/dL (ref 6–20)
CHLORIDE: 107 mmol/L (ref 101–111)
CO2: 23 mmol/L (ref 22–32)
CREATININE: 0.97 mg/dL (ref 0.44–1.00)
Calcium: 8.9 mg/dL (ref 8.9–10.3)
GFR calc Af Amer: >60 mL/min (ref >60)
GFR calc non Af Amer: >60 mL/min (ref >60)
Glucose, Bld: 96 mg/dL (ref 65–99)
POTASSIUM: 3.6 mmol/L (ref 3.5–5.1)
Sodium: 137 mmol/L (ref 135–145)

## 2016-12-05 LAB — TYPE AND SCREEN
ABO/RH(D): B POS
Antibody Screen: NEGATIVE

## 2016-12-05 NOTE — ED Provider Notes (Signed)
[facility] Emergency Department Provider Note  ____________________________________________   First MD Initiated Contact with Patient 12/05/16 1206     (approximate)  I have reviewed the triage vital signs and the nursing notes.   HISTORY  Chief Complaint Weakness    HPI Kaitlin Harris is a 46 y.o. female who self presents the emergency department with several days of fatigue and lightheadedness.  She has a long-standing history of uterine fibroids that have required a blood transfusion in the past and she is worried that she may be anemic requiring a transfusion again today. Her OB gynecologist is Dr. Leonides Schanz and the patient has failed progesterone treatment in the past. Last time she saw her gynecologist she was given a prescription for tranexamic acid which the patient said did not really help her very much. She is not currently bleeding. He seems to make her symptoms better or worse. She is not in any pain. Her bleeding has been insidious.    10/22/16 OB note:  Kaitlin Harris returns today for continued surveillance of her menorrhagia and symptomatic anemia.  She had a period in between our visits, and it was not as heavy but still pretty heavy - she didn't notice a significant change. Has not had in-between bleeding until just now, some spotting.  Her CBC returned with Hb 8.3, she is taking the iron but not feeing much improvement = still exhausted and appears pale.  Ultrasound today showed: Uterus: 11x6x7cm  Anteverted, left shifted EE 0.54cm  Midline (endometrial?) fibroid 4cm L intramural 1.5cm fibroid L fundal 1.4cm fibroid 9cm pedunculated fibroid vs adnexal mass on RIGHT  LO: [id number] with follicles RO: [id number] with follicles  +FF in posterior culdesac Reviewed images with patient.  EMB was performed last visit; negative for atypia or malignancy.  We had suggested birth control, but she did not want to try that yet.  She was suggested  ibuprofen 800mg  throughout period, but she says that made her bleed more She was given a progesterone taper in the midst of her period to try to lighten it, but it did only minimally  Seems the jist of all of her bleeding is due to her fibroids, and they are unresponsive to progesterone. Unlikely to be responsive to OCPS. Offered UFE and hysterectomy for definitive mangement. She will consider Gave Rx for Lysteda for 5days during period. She'll let me now how this goes.      Past Medical History:  Diagnosis Date  . Anemia   . Fibroids     Patient Active Problem List   Diagnosis Date Noted  . Symptomatic anemia 10/23/2016  . Iron deficiency anemia 06/22/2016  . Hypertension 09/23/2012  . Palpitations 09/23/2012  . Dizziness 09/23/2012  . Anxiety disorder 09/23/2012  . Chronic fatigue 09/23/2012    History reviewed. No pertinent surgical history.  Prior to Admission medications   Medication Sig Start Date End Date Taking? Authorizing Provider  citalopram (CELEXA) 20 MG tablet Take 1 tablet (20 mg total) by mouth daily. 06/08/12   Johnson, Clanford L, MD  Ferrous Sulfate (IRON) 325 (65 Fe) MG TABS Take 1 tablet by mouth 3 (three) times daily. Take three times daily every other day 06/22/16   Merlyn Lot, MD  ferrous sulfate 325 (65 FE) MG tablet Take 1 tablet (325 mg total) by mouth 3 (three) times daily with meals. 03/14/12   Bonk, John-Adam, MD  lisinopril-hydrochlorothiazide (ZESTORETIC) 20-12.5 MG per tablet Take 1 tablet by mouth daily. 09/23/12   Wynetta Emery,  Clanford L, MD  LORazepam (ATIVAN) 0.5 MG tablet Take 1 tablet (0.5 mg total) by mouth at bedtime as needed for anxiety (insomnia). 06/08/12   Murlean Iba, MD  Multiple Vitamin (MULTIVITAMIN WITH MINERALS) TABS Take 1 tablet by mouth daily.    [provider]  polyethylene glycol (MIRALAX / GLYCOLAX) packet Take 17 g by mouth daily. Mix one tablespoon with 8oz of your favorite juice or water every day until  you are having soft formed stools. Then start taking once daily if you didn't have a stool the day before. 06/22/16   Merlyn Lot, MD  potassium chloride (K-DUR) 10 MEQ tablet Take 1 tablet (10 mEq total) by mouth daily. 06/08/12   Murlean Iba, MD  promethazine (PHENERGAN) 12.5 MG tablet Take 1 tablet (12.5 mg total) by mouth every 6 (six) hours as needed for nausea or vomiting. 06/22/16   Merlyn Lot, MD    Allergies Patient has no known allergies.  Family History  Problem Relation Age of Onset  . Asthma Mother   . Thyroid disease Sister   . Asthma Brother     Social History Social History  Substance Use Topics  . Smoking status: Never Smoker  . Smokeless tobacco: Never Used  . Alcohol use No    Review of Systems Constitutional: No fever/chills Eyes: No visual changes. ENT: No sore throat. Cardiovascular: Denies chest pain. Respiratory: Denies shortness of breath. Gastrointestinal: Positive abdominal pain.  No nausea, no vomiting.  No diarrhea.  No constipation. Genitourinary: Negative for dysuria. Musculoskeletal: Negative for back pain. Skin: Negative for rash. Neurological: Negative for headaches, focal weakness or numbness.   ____________________________________________   PHYSICAL EXAM:  VITAL SIGNS: ED Triage Vitals [12/05/16 1205]  Enc Vitals Group     BP      Pulse      Resp      Temp      Temp src      SpO2      Weight      Height      Head Circumference      Peak Flow      Pain Score 0     Pain Loc      Pain Edu?      Excl. in [place]?     Constitutional: Alert and oriented 4 appears pale and tired no acute distress Eyes: PERRL EOMI. significant conjunctival pallor Head: Atraumatic. Nose: No congestion/rhinnorhea. Mouth/Throat: No trismus Neck: No stridor.   Cardiovascular: Tachycardic rate, regular rhythm. Grossly normal heart sounds.  Good peripheral circulation. Respiratory: Normal respiratory effort.  No retractions. Lungs  CTAB and moving good air Gastrointestinal: Soft nondistended nontender no rebound or guarding no peritonitis Musculoskeletal: No lower extremity edema   Neurologic:  Normal speech and language. No gross focal neurologic deficits are appreciated. Skin:  Skin is pale with no rash Psychiatric: Mood and affect are normal. Speech and behavior are normal.    ____________________________________________   DIFFERENTIAL includes but not limited to  Anemia, iron deficiency anemia, dehydration, metabolic derangement ____________________________________________   LABS (all labs ordered are listed, but only abnormal results are displayed)  Labs Reviewed  CBC - Abnormal; Notable for the following:       Result Value   RBC 3.13 (*)    Hemoglobin 7.9 (*)    HCT 24.6 (*)    MCV 78.5 (*)    MCH 25.2 (*)    RDW 20.7 (*)    All other components within normal limits  BASIC METABOLIC PANEL  URINALYSIS, COMPLETE (UACMP) WITH MICROSCOPIC  CBG MONITORING, ED  TYPE AND SCREEN    Hemoglobin of 7.9 with low MCV and high RDW consistent with iron deficiency __________________________________________  EKG  ED ECG REPORT I, Darel Hong, the attending physician, personally viewed and interpreted this ECG.  Date: 12/05/2016 Rate: 113 Rhythm: Sinus tachycardia QRS Axis: normal Intervals: normal ST/T Wave abnormalities: normal Narrative Interpretation: Abnormal  ____________________________________________  RADIOLOGY   ____________________________________________   PROCEDURES  Procedure(s) performed: no  Procedures  Critical Care performed: no  Observation: no ____________________________________________   INITIAL IMPRESSION / ASSESSMENT AND PLAN / ED COURSE  Pertinent labs & imaging results that were available during my care of the patient were reviewed by me and considered in my medical decision making (see chart for details).  The patient arrives spell and tachycardic as  well as hypertensive. She is not currently bleeding my concern is for symptomatic anemia. Labs are pending.   ----------------------------------------- 1:17 PM on 12/05/2016 -----------------------------------------  The patient's hemoglobin is 7.9 which is down from 10.1 after transfusion 6 weeks ago. I discussed the patient that her hemoglobin will likely continue to drop and as such and as she is somewhat symptomatic I offered her a blood transfusion. She declined which I think is entirely reasonable at this point. Her heart rate has come down to roughly 100 and she is able to ambulate without difficulty. She understands the risks involved in a blood transfusion and would prefer to hold off. She is currently only taking her iron supplementation 1 and may be 2 times a day so he advised her to increase it to 3 times a day and to make an appointment with her OB gynecologist this coming week for reevaluation and consideration of surgical treatment of her disease. Strict return precautions given and she is discharged home in good condition.  ____________________________________________   FINAL CLINICAL IMPRESSION(S) / ED DIAGNOSES  Final diagnoses:  Weakness  Iron deficiency anemia due to chronic blood loss      NEW MEDICATIONS STARTED DURING THIS VISIT:  New Prescriptions   No medications on file     Note:  This document was prepared using Dragon voice recognition software and may include unintentional dictation errors.     Darel Hong, MD 12/05/16 1318

## 2016-12-05 NOTE — Discharge Instructions (Signed)
Please begin taking your iron supplements 3 times a day instead of once a day. Today you're hemoglobin is down to 7.9 and I think it is reasonable to not get a blood transfusion as you have chosen not to, however your hemoglobin we'll continue to drop without further treatment. Please make an appointment to see Dr. Leonides Schanz in the next week to discuss your options.  Return to the ED for any concerns.  It was a pleasure to take care of you today, and thank you for coming to our emergency department.  If you have any questions or concerns before leaving please ask the nurse to grab me and I'm more than happy to go through your aftercare instructions again.  If you were prescribed any opioid pain medication today such as Norco, Vicodin, Percocet, morphine, hydrocodone, or oxycodone please make sure you do not drive when you are taking this medication as it can alter your ability to drive safely.  If you have any concerns once you are home that you are not improving or are in fact getting worse before you can make it to your follow-up appointment, please do not hesitate to call 911 and come back for further evaluation.  Darel Hong MD  Results for orders placed or performed during the hospital encounter of 85/46/27  Basic metabolic panel  Result Value Ref Range   Sodium 137 135 - 145 mmol/L   Potassium 3.6 3.5 - 5.1 mmol/L   Chloride 107 101 - 111 mmol/L   CO2 23 22 - 32 mmol/L   Glucose, Bld 96 65 - 99 mg/dL   BUN 17 6 - 20 mg/dL   Creatinine, Ser 0.97 0.44 - 1.00 mg/dL   Calcium 8.9 8.9 - 10.3 mg/dL   GFR calc non Af Amer >60 >60 mL/min   GFR calc Af Amer >60 >60 mL/min   Anion gap 7 5 - 15  CBC  Result Value Ref Range   WBC 5.5 3.6 - 11.0 K/uL   RBC 3.13 (L) 3.80 - 5.20 MIL/uL   Hemoglobin 7.9 (L) 12.0 - 16.0 g/dL   HCT 24.6 (L) 35.0 - 47.0 %   MCV 78.5 (L) 80.0 - 100.0 fL   MCH 25.2 (L) 26.0 - 34.0 pg   MCHC 32.1 32.0 - 36.0 g/dL   RDW 20.7 (H) 11.5 - 14.5 %   Platelets 235 150 - 440  K/uL

## 2016-12-05 NOTE — ED Triage Notes (Signed)
Pt arrived via [facility] with son with reports of weakness x 1 week. Weakness has been progressive. Pt states she has fibroids and has hx of heavy flow and was seen a few weeks ago and had to have a blood transfusion. Pt c/o shortness of breath on exertion. Pt is pale.  Pt reports weakness is similar to when she need at transfusion.

## 2018-06-20 ENCOUNTER — Encounter: Payer: Self-pay | Admitting: Emergency Medicine

## 2018-06-20 ENCOUNTER — Other Ambulatory Visit: Payer: Self-pay

## 2018-06-20 ENCOUNTER — Observation Stay
Admission: EM | Admit: 2018-06-20 | Discharge: 2018-06-21 | Disposition: A | Discharge status: Home / Self Care | Payer: BLUE CROSS/BLUE SHIELD | Attending: Obstetrics & Gynecology | Admitting: Obstetrics & Gynecology

## 2018-06-20 DIAGNOSIS — N921 Excessive and frequent menstruation with irregular cycle: Secondary | ICD-10-CM | POA: Insufficient documentation

## 2018-06-20 DIAGNOSIS — R42 Dizziness and giddiness: Secondary | ICD-10-CM | POA: Diagnosis present

## 2018-06-20 DIAGNOSIS — D259 Leiomyoma of uterus, unspecified: Secondary | ICD-10-CM | POA: Diagnosis not present

## 2018-06-20 DIAGNOSIS — D649 Anemia, unspecified: Secondary | ICD-10-CM | POA: Diagnosis present

## 2018-06-20 DIAGNOSIS — D62 Acute posthemorrhagic anemia: Principal | ICD-10-CM | POA: Insufficient documentation

## 2018-06-20 DIAGNOSIS — N939 Abnormal uterine and vaginal bleeding, unspecified: Secondary | ICD-10-CM

## 2018-06-20 LAB — CBC WITH DIFFERENTIAL/PLATELET
Abs Immature Granulocytes: 0.02 10*3/uL (ref 0.00–0.07)
BASOS PCT: 1 %
Basophils Absolute: 0 10*3/uL (ref 0.0–0.1)
EOS ABS: 0.1 10*3/uL (ref 0.0–0.5)
EOS PCT: 3 %
HCT: 18.4 % — ABNORMAL LOW (ref 36.0–46.0)
Hemoglobin: 5.1 g/dL — ABNORMAL LOW (ref 12.0–15.0)
Immature Granulocytes: 1 %
Lymphocytes Relative: 26 %
Lymphs Abs: 1 10*3/uL (ref 0.7–4.0)
MCH: 19.5 pg — AB (ref 26.0–34.0)
MCHC: 27.7 g/dL — AB (ref 30.0–36.0)
MCV: 70.2 fL — AB (ref 80.0–100.0)
MONO ABS: 0.4 10*3/uL (ref 0.1–1.0)
MONOS PCT: 10 %
Neutro Abs: 2.4 10*3/uL (ref 1.7–7.7)
Neutrophils Relative %: 59 %
PLATELETS: 261 10*3/uL (ref 150–400)
RBC: 2.62 MIL/uL — AB (ref 3.87–5.11)
RDW: 19.6 % — AB (ref 11.5–15.5)
WBC: 3.9 10*3/uL — AB (ref 4.0–10.5)
nRBC: 0 % (ref 0.0–0.2)

## 2018-06-20 LAB — BASIC METABOLIC PANEL
Anion gap: 6 (ref 5–15)
BUN: 10 mg/dL (ref 6–20)
CHLORIDE: 108 mmol/L (ref 98–111)
CO2: 23 mmol/L (ref 22–32)
CREATININE: 0.62 mg/dL (ref 0.44–1.00)
Calcium: 8.3 mg/dL — ABNORMAL LOW (ref 8.9–10.3)
GFR calc Af Amer: >60 mL/min (ref >60)
GFR calc non Af Amer: >60 mL/min (ref >60)
Glucose, Bld: 98 mg/dL (ref 70–99)
POTASSIUM: 3.7 mmol/L (ref 3.5–5.1)
Sodium: 137 mmol/L (ref 135–145)

## 2018-06-20 LAB — PREPARE RBC (CROSSMATCH)

## 2018-06-20 LAB — APTT: aPTT: 25 seconds (ref 24–36)

## 2018-06-20 LAB — PROTIME-INR
INR: 0.9
PROTHROMBIN TIME: 12.1 s (ref 11.4–15.2)

## 2018-06-20 MED ORDER — ACETAMINOPHEN 325 MG PO TABS
650.0000 mg | ORAL_TABLET | Freq: Once | ORAL | Status: AC
  Administered 2018-06-20: 650 mg via ORAL
  Filled 2018-06-20: qty 2

## 2018-06-20 MED ORDER — ONDANSETRON HCL 4 MG PO TABS: 4.0000 mg | ORAL_TABLET | Freq: Four times a day (QID) | ORAL | Status: DC | PRN

## 2018-06-20 MED ORDER — SIMETHICONE 80 MG PO CHEW
80.0000 mg | CHEWABLE_TABLET | Freq: Four times a day (QID) | ORAL | Status: DC | PRN
Start: 2018-06-20 — End: 2018-06-21
  Filled 2018-06-20: qty 1

## 2018-06-20 MED ORDER — SODIUM CHLORIDE 0.9 % IV SOLN
10.0000 mL/h | Freq: Once | INTRAVENOUS | Status: AC
  Administered 2018-06-20: 10 mL/h via INTRAVENOUS

## 2018-06-20 MED ORDER — SODIUM CHLORIDE 0.9 % IV SOLN: Freq: Once | INTRAVENOUS | Status: DC

## 2018-06-20 MED ORDER — MEDROXYPROGESTERONE ACETATE 10 MG PO TABS: 10.0000 mg | ORAL_TABLET | Freq: Two times a day (BID) | ORAL | Status: DC

## 2018-06-20 MED ORDER — FUROSEMIDE 10 MG/ML IJ SOLN
20.0000 mg | Freq: Once | INTRAMUSCULAR | Status: AC
  Administered 2018-06-21: 20 mg via INTRAVENOUS
  Filled 2018-06-20: qty 4
  Filled 2018-06-20: qty 2

## 2018-06-20 MED ORDER — DIPHENHYDRAMINE HCL 25 MG PO CAPS
25.0000 mg | ORAL_CAPSULE | Freq: Once | ORAL | Status: AC
  Administered 2018-06-20: 25 mg via ORAL
  Filled 2018-06-20: qty 1

## 2018-06-20 MED ORDER — IBUPROFEN 600 MG PO TABS: 600.0000 mg | ORAL_TABLET | Freq: Four times a day (QID) | ORAL | Status: DC | PRN

## 2018-06-20 MED ORDER — ONDANSETRON HCL 4 MG/2ML IJ SOLN: 4.0000 mg | Freq: Four times a day (QID) | INTRAMUSCULAR | Status: DC | PRN

## 2018-06-20 NOTE — ED Notes (Signed)
Pt denies pain or any needs.

## 2018-06-20 NOTE — ED Notes (Signed)
Pt c/o HA.

## 2018-06-20 NOTE — ED Notes (Signed)
Pt given remote to tv. Denies any other needs.

## 2018-06-20 NOTE — ED Notes (Signed)
Pt signed blood consent form. This RN as witness. Placed in paper-chart.

## 2018-06-20 NOTE — H&P (Signed)
Consult History and Physical   SERVICE: Gynecology   Patient Name: Kaitlin Harris Patient MRN:   [id number]  CC: vaginal bleeding  HPI: Kaitlin Harris is a 48 y.o. known to me with fibroid uterus and heavy uterine bleeding.  She was lost to follow up due to lapse in insurance, We made several attempts to contact her over the last year.  She has been unresponsive to hormonal manipulation and had been offered definitive treatment with surgery or ablation. She was last seen by me  Today she presents to the ED with complaints of vaginal bleeding and dizziness.   Location: vagina, uterus Onset/timing: 2 weeks ago, constant Duration: 2 weeks, stopped today Quality: heavy with clots Severity: severe but now mild Aggravating or alleviating conditions: nothing has made better or worse Associated signs/symptoms: +dyspnea on exertion, dizziness. No pain, no CP, no increased weakness.    Patient's last menstrual period was 06/05/2018 (approximate).   POB: G2P2 PGYN: fibroids, heavy menses, menometrorrhagia PMED: anemia PSUG: none Famhx: thyroid disease in sister, asthma in brother Social hx: no smoking, alcohol, illicit drug use Allergie: none known Current meds: none  Review of Systems: positives in bold GEN:   fevers, chills, weight changes, appetite changes, fatigue, night sweats HEENT:  HA, vision changes, hearing loss, congestion, rhinorrhea, sinus pressure, dysphagia CV:   CP, palpitations PULM:  SOB, cough GI:  abd pain, N/V/D/C GU:  dysuria, urgency, frequency MSK:  arthralgias, myalgias, back pain, swelling SKIN:  rashes, color changes, pallor NEURO:  numbness, weakness, tingling, seizures, dizziness, tremors PSYCH:  depression, anxiety, behavioral problems, confusion  HEME/LYMPH:  easy bruising or bleeding ENDO:  heat/cold intolerance  Home Medications:  Medications reconciled in EPIC  No current facility-administered medications on file prior to encounter.     Current Outpatient Medications on File Prior to Encounter  Medication Sig Dispense Refill  . Ferrous Sulfate (IRON) 325 (65 Fe) MG TABS Take 1 tablet by mouth 3 (three) times daily. Take three times daily every other day 60 each 0  . [DISCONTINUED] hydrochlorothiazide (HYDRODIURIL) 12.5 MG tablet Take 1 tablet (12.5 mg total) by mouth daily. 30 tablet 3    Allergies:  No Known Allergies  Physical Exam:  Temp:  [99.2 F (37.3 C)] 99.2 F (37.3 C) (01/20 2004) Pulse Rate:  [101-118] 101 (01/20 2100) Resp:  [20] 20 (01/20 2004) BP: (122-197)/(71-106) 122/71 (01/20 2131) SpO2:  [100 %] 100 % (01/20 2100) Weight:  [77.1 kg] 77.1 kg (01/20 2006)   General Appearance:  Well developed, well nourished, no acute distress, alert and oriented, cooperative and appears stated age 55:  Normocephalic atraumatic, extraocular movements intact, moist mucous membranes, neck supple with midline trachea and thyroid without masses Cardiovascular:  Normal S1/S2, tachycardic, regular rhythm, no murmurs, 2+ distal pulses Pulmonary:  clear to auscultation, no wheezes, rales or rhonchi, symmetric air entry, good air exchange Abdomen:  Bowel sounds present, soft, nontender, nondistended, no abnormal masses or organomegaly, no epigastric pain Extremities:  extremities normal, no tenderness, atraumatic, no cyanosis or edema Skin:  normal coloration and turgor, no rashes, no suspicious skin lesions noted  Neurologic:  Cranial nerves 2-12 grossly intact, grossly equal strength and muscle tone, normal speech, no focal findings or movement disorder noted. Psychiatric:  Normal mood and affect, appropriate, no AH/VH Pelvic:  deferred   Labs/Studies:  Results for orders placed or performed during the hospital encounter of 06/20/18 (from the past 24 hour(s))  CBC with Differential/Platelet     Status: Abnormal  Collection Time: 06/20/18  8:38 PM  Result Value Ref Range   WBC 3.9 (L) 4.0 - 10.5 K/uL   RBC 2.62  (L) 3.87 - 5.11 MIL/uL   Hemoglobin 5.1 (L) 12.0 - 15.0 g/dL   HCT 18.4 (L) 36.0 - 46.0 %   MCV 70.2 (L) 80.0 - 100.0 fL   MCH 19.5 (L) 26.0 - 34.0 pg   MCHC 27.7 (L) 30.0 - 36.0 g/dL   RDW 19.6 (H) 11.5 - 15.5 %   Platelets 261 150 - 400 K/uL   nRBC 0.0 0.0 - 0.2 %   Neutrophils Relative % 59 %   Neutro Abs 2.4 1.7 - 7.7 K/uL   Lymphocytes Relative 26 %   Lymphs Abs 1.0 0.7 - 4.0 K/uL   Monocytes Relative 10 %   Monocytes Absolute 0.4 0.1 - 1.0 K/uL   Eosinophils Relative 3 %   Eosinophils Absolute 0.1 0.0 - 0.5 K/uL   Basophils Relative 1 %   Basophils Absolute 0.0 0.0 - 0.1 K/uL   Immature Granulocytes 1 %   Abs Immature Granulocytes 0.02 0.00 - 0.07 K/uL  Protime-INR     Status: None   Collection Time: 06/20/18  8:38 PM  Result Value Ref Range   Prothrombin Time 12.1 11.4 - 15.2 seconds   INR 0.90   APTT     Status: None   Collection Time: 06/20/18  8:38 PM  Result Value Ref Range   aPTT 25 24 - 36 seconds  Basic metabolic panel     Status: Abnormal   Collection Time: 06/20/18  8:38 PM  Result Value Ref Range   Sodium 137 135 - 145 mmol/L   Potassium 3.7 3.5 - 5.1 mmol/L   Chloride 108 98 - 111 mmol/L   CO2 23 22 - 32 mmol/L   Glucose, Bld 98 70 - 99 mg/dL   BUN 10 6 - 20 mg/dL   Creatinine, Ser 0.62 0.44 - 1.00 mg/dL   Calcium 8.3 (L) 8.9 - 10.3 mg/dL   GFR calc non Af Amer >60 >60 mL/min   GFR calc Af Amer >60 >60 mL/min   Anion gap 6 5 - 15  Type and screen     Status: None (Preliminary result)   Collection Time: 06/20/18  8:38 PM  Result Value Ref Range   ABO/RH(D) B POS    Antibody Screen NEG    Sample Expiration 06/23/2018    Unit Number [id number]    Blood Component Type RED CELLS,LR    Unit division 00    Status of Unit ISSUED    Transfusion Status OK TO TRANSFUSE    Crossmatch Result      Compatible Performed at [facility], [address] [place], [place] [address]    Unit Number [id number]    Blood Component Type RED  CELLS,LR    Unit division 00    Status of Unit ALLOCATED    Transfusion Status OK TO TRANSFUSE    Crossmatch Result Compatible   Prepare RBC     Status: None   Collection Time: 06/20/18  8:57 PM  Result Value Ref Range   Order Confirmation      ORDER PROCESSED BY BLOOD BANK Performed at [facility], [address] [place], [place] [address]     Imaging: No results found.   Assessment / Plan:   Kaitlin Harris is a 48 y.o.  With known fibroid uterus and acute on chronic anemia due to menometrorrhagia.  1. Profound Anemia:  Acute on chronic.  Needs blood transfusion, and definitive treatment.  Hopefully now with insurance she will be able to pursue embolization or hysterectomy.   2. Admit to 3rd floor for transfusion.    Thank you for the opportunity to be involved with this patient's care.  ----- Larey Days, MD Attending Obstetrician and Gynecologist [facility], Department of [facility]

## 2018-06-20 NOTE — ED Notes (Signed)
Blood transfusion rate inc to 265mL/hr.

## 2018-06-20 NOTE — ED Provider Notes (Addendum)
[facility] Emergency Department Provider Note  ____________________________________________  Time seen: Approximately 8:31 PM  I have reviewed the triage vital signs and the nursing notes.   HISTORY  Chief Complaint Vaginal Bleeding and Dizziness   HPI Kaitlin Harris is a 48 y.o. female with a history of uterine fibroids and anemia who presents for evaluation of vaginal bleeding.  Patient has been seen on and off since 2018 at different ERs for vaginal bleeding and severe anemia.  She is usually transfused and sent to follow-up with OB/GYN however due to lack of insurance she has been unable to do so.  She has ultrasound confirms uterine fibroids.  She reports that her vaginal bleeding started again 2 weeks ago.  Initially heavy with several blood clots however slowly the bleeding has improved and stopped completely today.  She has had dyspnea with exertion and mild dizziness which prompted her visit to the emergency room for concerns of needing transfusion.  She denies abdominal pain.  She does not have any personal or family history of bleeding disorders.  She is not on blood thinners.  Past Medical History:  Diagnosis Date  . Anemia   . Fibroids     Patient Active Problem List   Diagnosis Date Noted  . Symptomatic anemia 10/23/2016  . Iron deficiency anemia 06/22/2016  . Hypertension 09/23/2012  . Palpitations 09/23/2012  . Dizziness 09/23/2012  . Anxiety disorder 09/23/2012  . Chronic fatigue 09/23/2012    History reviewed. No pertinent surgical history.  Prior to Admission medications   Medication Sig Start Date End Date Taking? Authorizing Provider  citalopram (CELEXA) 20 MG tablet Take 1 tablet (20 mg total) by mouth daily. 06/08/12   Johnson, Clanford L, MD  Ferrous Sulfate (IRON) 325 (65 Fe) MG TABS Take 1 tablet by mouth 3 (three) times daily. Take three times daily every other day 06/22/16   Merlyn Lot, MD  ferrous sulfate 325  (65 FE) MG tablet Take 1 tablet (325 mg total) by mouth 3 (three) times daily with meals. 03/14/12   Bonk, John-Adam, MD  lisinopril-hydrochlorothiazide (ZESTORETIC) 20-12.5 MG per tablet Take 1 tablet by mouth daily. 09/23/12   Johnson, Clanford L, MD  LORazepam (ATIVAN) 0.5 MG tablet Take 1 tablet (0.5 mg total) by mouth at bedtime as needed for anxiety (insomnia). 06/08/12   Murlean Iba, MD  Multiple Vitamin (MULTIVITAMIN WITH MINERALS) TABS Take 1 tablet by mouth daily.    [provider]  polyethylene glycol (MIRALAX / GLYCOLAX) packet Take 17 g by mouth daily. Mix one tablespoon with 8oz of your favorite juice or water every day until you are having soft formed stools. Then start taking once daily if you didn't have a stool the day before. 06/22/16   Merlyn Lot, MD  potassium chloride (K-DUR) 10 MEQ tablet Take 1 tablet (10 mEq total) by mouth daily. 06/08/12   Murlean Iba, MD  promethazine (PHENERGAN) 12.5 MG tablet Take 1 tablet (12.5 mg total) by mouth every 6 (six) hours as needed for nausea or vomiting. 06/22/16   Merlyn Lot, MD  hydrochlorothiazide (HYDRODIURIL) 12.5 MG tablet Take 1 tablet (12.5 mg total) by mouth daily. 06/08/12 09/23/12  Murlean Iba, MD    Allergies Patient has no known allergies.  Family History  Problem Relation Age of Onset  . Asthma Mother   . Thyroid disease Sister   . Asthma Brother     Social History Social History   Tobacco Use  .  Smoking status: Never Smoker  . Smokeless tobacco: Never Used  Substance Use Topics  . Alcohol use: No  . Drug use: No    Review of Systems  Constitutional: Negative for fever. + dizziness Eyes: Negative for visual changes. ENT: Negative for sore throat. Neck: No neck pain  Cardiovascular: Negative for chest pain. Respiratory: Negative for shortness of breath. + DOE Gastrointestinal: Negative for abdominal pain, vomiting or diarrhea. Genitourinary: Negative for dysuria. +  vaginal bleeding Musculoskeletal: Negative for back pain. Skin: Negative for rash. Neurological: Negative for headaches, weakness or numbness. Psych: No SI or HI  ____________________________________________   PHYSICAL EXAM:  VITAL SIGNS: ED Triage Vitals  Enc Vitals Group     BP 06/20/18 2004 (!) 197/106     Pulse Rate 06/20/18 2004 (!) 118     Resp 06/20/18 2004 20     Temp 06/20/18 2004 99.2 F (37.3 C)     Temp Source 06/20/18 2004 Oral     SpO2 06/20/18 2004 100 %     Weight 06/20/18 2006 170 lb (77.1 kg)     Height 06/20/18 2006 5\' 3"  (1.6 m)     Head Circumference --      Peak Flow --      Pain Score 06/20/18 2005 5     Pain Loc --      Pain Edu? --      Excl. in [place]? --     Constitutional: Alert and oriented, looks pale, no distress.  HEENT:      Head: Normocephalic and atraumatic.         Eyes: Conjunctivae are normal. Sclera is non-icteric.       Mouth/Throat: Mucous membranes are moist.       Neck: Supple with no signs of meningismus. Cardiovascular: Tachycardic with regular rhythm. No murmurs, gallops, or rubs. 2+ symmetrical distal pulses are present in all extremities. No JVD. Respiratory: Normal respiratory effort. Lungs are clear to auscultation bilaterally. No wheezes, crackles, or rhonchi.  Gastrointestinal: Soft, non tender, and non distended with positive bowel sounds. No rebound or guarding. Pelvic exam: Normal external genitalia, no rashes or lesions. Os closed with no active bleeding.  Musculoskeletal: Nontender with normal range of motion in all extremities. No edema, cyanosis, or erythema of extremities. Neurologic: Normal speech and language. Face is symmetric. Moving all extremities. No gross focal neurologic deficits are appreciated. Skin: Skin is warm, dry and intact. No rash noted. Psychiatric: Mood and affect are normal. Speech and behavior are normal.  ____________________________________________   LABS (all labs ordered are listed, but  only abnormal results are displayed)  Labs Reviewed  CBC WITH DIFFERENTIAL/PLATELET - Abnormal; Notable for the following components:      Result Value   WBC 3.9 (*)    RBC 2.62 (*)    Hemoglobin 5.1 (*)    HCT 18.4 (*)    MCV 70.2 (*)    MCH 19.5 (*)    MCHC 27.7 (*)    RDW 19.6 (*)    All other components within normal limits  BASIC METABOLIC PANEL - Abnormal; Notable for the following components:   Calcium 8.3 (*)    All other components within normal limits  PROTIME-INR  APTT  TYPE AND SCREEN  PREPARE RBC (CROSSMATCH)   ____________________________________________  EKG  none  ____________________________________________  RADIOLOGY  none  ____________________________________________   PROCEDURES  Procedure(s) performed: None Procedures Critical Care performed:  CRITICAL CARE Performed by: Rudene Re  ?  Total critical  care time: 35 min  Critical care time was exclusive of separately billable procedures and treating other patients.  Critical care was necessary to treat or prevent imminent or life-threatening deterioration.  Critical care was time spent personally by me on the following activities: development of treatment plan with patient and/or surrogate as well as nursing, discussions with consultants, evaluation of patient's response to treatment, examination of patient, obtaining history from patient or surrogate, ordering and performing treatments and interventions, ordering and review of laboratory studies, ordering and review of radiographic studies, pulse oximetry and re-evaluation of patient's condition.  ____________________________________________   INITIAL IMPRESSION / ASSESSMENT AND PLAN / ED COURSE  48 y.o. female with a history of uterine fibroids and anemia who presents for evaluation of vaginal bleeding in the setting of fibroids.  Patient looks pale and tachycardic, blood pressure is actually elevated at 197/106.  Bleeding has  stopped over the last 24 hours.  Will check coags, CBC and platelets.  Patient will most likely need a transfusion.  Will consult Dr. Vikki Ports Ward from OB/GYN for close follow-up for hysterectomy.    _________________________ 9:25 PM on 06/20/2018 -----------------------------------------  Pelvic exam no active bleeding.  Hemoglobin of 5.1.  Discussed with Dr. Vikki Ports Ward who recommended admission for transfusion.   As part of my medical decision making, I reviewed the following data within the [place] notes reviewed and incorporated, Labs reviewed , Old chart reviewed, A consult was requested and obtained from this/these consultant(s) ObGYN, Notes from prior ED visits and [place] Controlled Substance Database    Pertinent labs & imaging results that were available during my care of the patient were reviewed by me and considered in my medical decision making (see chart for details).    ____________________________________________   FINAL CLINICAL IMPRESSION(S) / ED DIAGNOSES  Final diagnoses:  Vaginal bleeding  Acute blood loss anemia      NEW MEDICATIONS STARTED DURING THIS VISIT:  ED Discharge Orders    None       Note:  This document was prepared using Dragon voice recognition software and may include unintentional dictation errors.    Rudene Re, MD 06/20/18 2125    Rudene Re, MD 06/20/18 2126

## 2018-06-20 NOTE — ED Triage Notes (Addendum)
Pt presents to ED with frequent heavy vaginal bleeding. Pt states she has been bleeding for the past 2 weeks and is now feeling dizzy and weak. Pt was told about a year ago she needed to have her uterus removed due to her bleeding (caused by fibroids). Pt pale and gait slightly unsteady. Pt states the last time her labs were drawn her hemoglobin was very low. Has not been rechecked since then.

## 2018-06-20 NOTE — ED Notes (Signed)
Verb no EKG necessary per EDP Veronese.

## 2018-06-20 NOTE — ED Notes (Signed)
Pt up to bedside toilet.  

## 2018-06-21 LAB — PREPARE RBC (CROSSMATCH)

## 2018-06-21 LAB — HEMOGLOBIN AND HEMATOCRIT, BLOOD
HEMATOCRIT: 26.5 % — AB (ref 36.0–46.0)
Hemoglobin: 8.3 g/dL — ABNORMAL LOW (ref 12.0–15.0)

## 2018-06-21 MED ORDER — SODIUM CHLORIDE 0.9% IV SOLUTION: Freq: Once | INTRAVENOUS | Status: DC

## 2018-06-21 MED ORDER — FUROSEMIDE 10 MG/ML IJ SOLN
20.0000 mg | Freq: Once | INTRAMUSCULAR | Status: DC
  Filled 2018-06-21: qty 2

## 2018-06-21 NOTE — Progress Notes (Signed)
DC to home via wc with auxillary

## 2018-06-21 NOTE — Discharge Instructions (Signed)
Please follow with Dr. Leonides Schanz as soon as possible to schedule surgery.

## 2018-06-21 NOTE — Progress Notes (Signed)
DC instr reviewed and pt understands f/u.  Work letter from MD given to pt.

## 2018-06-22 LAB — TYPE AND SCREEN
ABO/RH(D): B POS
ANTIBODY SCREEN: NEGATIVE
UNIT DIVISION: 0
UNIT DIVISION: 0
Unit division: 0

## 2018-06-22 LAB — BPAM RBC
BLOOD PRODUCT EXPIRATION DATE: 202002192359
Blood Product Expiration Date: 202002192359
Blood Product Expiration Date: 202002202359
ISSUE DATE / TIME: 202001210115
ISSUE DATE / TIME: 202001202212
UNIT TYPE AND RH: 7300
Unit Type and Rh: 7300
Unit Type and Rh: 7300

## 2018-06-24 NOTE — Discharge Summary (Signed)
Gynecology Discharge Summary  Patient ID: Kaitlin Harris MRN: [id number] DOB/AGE: [birth year] 48 y.o.  Admit Date: 06/20/2018 Discharge Date: 06/24/2018  Admission Diagnoses: acute on chronic anemia, menometrorrhagia, fibroid uterus Discharge Diagnoses: same   Procedures: transfusion of 3u PRBCs  CBC Latest Ref Rng & Units 06/21/2018 06/20/2018 12/05/2016  WBC 4.0 - 10.5 K/uL - 3.9(L) 5.5  Hemoglobin 12.0 - 15.0 g/dL 8.3(L) 5.1(L) 7.9(L)  Hematocrit 36.0 - 46.0 % 26.5(L) 18.4(L) 24.6(L)  Platelets 150 - 400 K/uL - 261 235    Hospital Course:  Kaitlin Harris is a 48 y.o.  admitted for profound anemia.  She received 3u PRBCs and felt improved symptomatically.  She had been counseled previously about surgery as she is unresponsive to hormonal treatments, but delayed due to lapse in insurance coverage.  She has coverage now and would like to proceed with surgery, and sooner than later. By time of discharge, she was ambulating, voiding without difficulty, tolerating regular diet and passing flatus. She was deemed stable for discharge to home.  She was not bleeding vaginally.  Discharge Exam: Blood pressure (!) 146/84, pulse 89, temperature 98.7 F (37.1 C), temperature source Oral, resp. rate 20, height 5\' 3"  (1.6 m), weight 77.1 kg, last menstrual period 06/05/2018, SpO2 100 %. General appearance: alert and no distress  Resp: clear to auscultation bilaterally, normal respiratory effort Cardio: regular rate and rhythm  GI: soft, non-tender; bowel sounds normal; no masses, no organomegaly.  Incision: C/D/I, no erythema, no drainage noted Pelvic: deferred Extremities: extremities normal, atraumatic, no cyanosis or edema and Homans sign is negative, no sign of DVT  Discharged Condition: Stable  Disposition:  stable   Allergies as of 06/21/2018   No Known Allergies     Medication List    TAKE these medications   Iron 325 (65 Fe) MG Tabs Take 1 tablet by mouth 3 (three) times  daily. Take three times daily every other day      Follow-up Information    Ronee Ranganathan, Honor Loh, MD Follow up in 1 week(s).   Specialty:  Obstetrics and Gynecology Contact information: [place] [address] [phone]           Signed:  Van Attending [facility]

## 2020-08-20 ENCOUNTER — Other Ambulatory Visit: Payer: Self-pay

## 2020-08-20 ENCOUNTER — Emergency Department
Admission: EM | Admit: 2020-08-20 | Discharge: 2020-08-20 | Discharge status: Against Medical Advice | Payer: BLUE CROSS/BLUE SHIELD | Attending: Emergency Medicine | Admitting: Emergency Medicine

## 2020-08-20 DIAGNOSIS — N945 Secondary dysmenorrhea: Secondary | ICD-10-CM | POA: Insufficient documentation

## 2020-08-20 DIAGNOSIS — D649 Anemia, unspecified: Secondary | ICD-10-CM | POA: Insufficient documentation

## 2020-08-20 DIAGNOSIS — D259 Leiomyoma of uterus, unspecified: Secondary | ICD-10-CM | POA: Insufficient documentation

## 2020-08-20 DIAGNOSIS — I10 Essential (primary) hypertension: Secondary | ICD-10-CM | POA: Insufficient documentation

## 2020-08-20 DIAGNOSIS — N946 Dysmenorrhea, unspecified: Secondary | ICD-10-CM

## 2020-08-20 LAB — BASIC METABOLIC PANEL
Anion gap: 9 (ref 5–15)
BUN: 5 mg/dL — ABNORMAL LOW (ref 6–20)
CO2: 21 mmol/L — ABNORMAL LOW (ref 22–32)
Calcium: 8.2 mg/dL — ABNORMAL LOW (ref 8.9–10.3)
Chloride: 106 mmol/L (ref 98–111)
Creatinine, Ser: 0.55 mg/dL (ref 0.44–1.00)
GFR, Estimated: >60 mL/min (ref >60)
Glucose, Bld: 110 mg/dL — ABNORMAL HIGH (ref 70–99)
Potassium: 3.1 mmol/L — ABNORMAL LOW (ref 3.5–5.1)
Sodium: 136 mmol/L (ref 135–145)

## 2020-08-20 LAB — CBC
HCT: 17.9 % — ABNORMAL LOW (ref 36.0–46.0)
Hemoglobin: 5.1 g/dL — ABNORMAL LOW (ref 12.0–15.0)
MCH: 19.8 pg — ABNORMAL LOW (ref 26.0–34.0)
MCHC: 28.5 g/dL — ABNORMAL LOW (ref 30.0–36.0)
MCV: 69.4 fL — ABNORMAL LOW (ref 80.0–100.0)
Platelets: 421 10*3/uL — ABNORMAL HIGH (ref 150–400)
RBC: 2.58 MIL/uL — ABNORMAL LOW (ref 3.87–5.11)
RDW: 18.6 % — ABNORMAL HIGH (ref 11.5–15.5)
WBC: 6.2 10*3/uL (ref 4.0–10.5)
nRBC: 0 % (ref 0.0–0.2)

## 2020-08-20 LAB — PREPARE RBC (CROSSMATCH)

## 2020-08-20 MED ORDER — SODIUM CHLORIDE 0.9 % IV SOLN: 10.0000 mL/h | Freq: Once | INTRAVENOUS | Status: DC

## 2020-08-20 NOTE — ED Provider Notes (Signed)
[facility] Emergency Department Provider Note   ____________________________________________   Event Date/Time   First MD Initiated Contact with Patient 08/20/20 [phone]     (approximate)  I have reviewed the triage vital signs and the nursing notes.   HISTORY  Chief Complaint Weakness    HPI Kaitlin Harris is a 50 y.o. female with a stated past medical history of uterine fibroids causing excessive bleeding leading to recurrent anemia and low iron levels.  Patient states that she has been having worsening dyspnea on exertion, increasing weakness, fatigue, and orthostatic lightheadedness over the last 2 weeks.  Patient states that over this time she has had increased vaginal bleeding as well however states that it has stopped yesterday.  Patient denies any syncopal episodes.  Patient currently denies any vision changes, tinnitus, difficulty speaking, facial droop, sore throat, chest pain, shortness of breath, abdominal pain, nausea/vomiting/diarrhea, dysuria         Past Medical History:  Diagnosis Date   Anemia    Fibroids     Patient Active Problem List   Diagnosis Date Noted   Anemia 06/20/2018   Symptomatic anemia 10/23/2016   Iron deficiency anemia 06/22/2016   Hypertension 09/23/2012   Palpitations 09/23/2012   Dizziness 09/23/2012   Anxiety disorder 09/23/2012   Chronic fatigue 09/23/2012    History reviewed. No pertinent surgical history.  Prior to Admission medications   Medication Sig Start Date End Date Taking? Authorizing Provider  Ferrous Sulfate (IRON) 325 (65 Fe) MG TABS Take 1 tablet by mouth 3 (three) times daily. Take three times daily every other day 06/22/16   Merlyn Lot, MD    Allergies Patient has no known allergies.  Family History  Problem Relation Age of Onset   Asthma Mother    Thyroid disease Sister    Asthma Brother     Social History Social History   Tobacco Use   Smoking  status: Never Smoker   Smokeless tobacco: Never Used  Scientific laboratory technician Use: Never used  Substance Use Topics   Alcohol use: No   Drug use: No    Review of Systems Constitutional: No fever/chills. Endorses generalized fatigue Eyes: No visual changes. ENT: No sore throat. Cardiovascular: Denies chest pain. Respiratory: Denies shortness of breath. Gastrointestinal: No abdominal pain.  No nausea, no vomiting.  No diarrhea. Genitourinary: Negative for dysuria. Musculoskeletal: Negative for acute arthralgias Skin: Negative for rash. Neurological: Negative for headaches, numbness/paresthesias in any extremity Psychiatric: Negative for suicidal ideation/homicidal ideation   ____________________________________________   PHYSICAL EXAM:  VITAL SIGNS: ED Triage Vitals  Enc Vitals Group     BP 08/20/20 0921 (!) 194/95     Pulse Rate 08/20/20 0921 (!) 114     Resp 08/20/20 0921 16     Temp 08/20/20 0921 98.3 F (36.8 C)     Temp Source 08/20/20 0921 Oral     SpO2 08/20/20 0921 100 %     Weight 08/20/20 0917 150 lb (68 kg)     Height 08/20/20 0917 5\' 3"  (1.6 m)     Head Circumference --      Peak Flow --      Pain Score 08/20/20 0917 0     Pain Loc --      Pain Edu? --      Excl. in [place]? --    Constitutional: Alert and oriented. Well appearing and in no acute distress. Eyes: Conjunctivae are pallid. PERRL. Head: Atraumatic. Nose: No congestion/rhinnorhea.  Mouth/Throat: Mucous membranes are moist. Neck: No stridor Cardiovascular: Grossly normal heart sounds.  Good peripheral circulation. Respiratory: Normal respiratory effort.  No retractions. Gastrointestinal: Soft and nontender. No distention. Musculoskeletal: No obvious deformities Neurologic:  Normal speech and language. No gross focal neurologic deficits are appreciated. Skin:  Skin is warm and dry. No rash noted.  Generalized pallor Psychiatric: Mood and affect are normal. Speech and behavior are  normal.  ____________________________________________   LABS (all labs ordered are listed, but only abnormal results are displayed)  Labs Reviewed  BASIC METABOLIC PANEL - Abnormal; Notable for the following components:      Result Value   Potassium 3.1 (*)    CO2 21 (*)    Glucose, Bld 110 (*)    BUN 5 (*)    Calcium 8.2 (*)    All other components within normal limits  CBC - Abnormal; Notable for the following components:   RBC 2.58 (*)    Hemoglobin 5.1 (*)    HCT 17.9 (*)    MCV 69.4 (*)    MCH 19.8 (*)    MCHC 28.5 (*)    RDW 18.6 (*)    Platelets 421 (*)    All other components within normal limits  URINALYSIS, COMPLETE (UACMP) WITH MICROSCOPIC  CBG MONITORING, ED  TYPE AND SCREEN  PREPARE RBC (CROSSMATCH)   ____________________________________________  EKG  ED ECG REPORT I, Naaman Plummer, the attending physician, personally viewed and interpreted this ECG.  Date: 08/20/2020 EKG Time: 0925 Rate: 118 Rhythm: Tachycardic sinus rhythm QRS Axis: normal Intervals: normal ST/T Wave abnormalities: normal Narrative Interpretation: no evidence of acute ischemia  PROCEDURES  Procedure(s) performed (including Critical Care):  Procedures   ____________________________________________   INITIAL IMPRESSION / ASSESSMENT AND PLAN / ED COURSE  As part of my medical decision making, I reviewed the following data within the [facility] notes reviewed and incorporated, Labs reviewed, EKG interpreted, Old chart reviewed, and Notes from prior ED visits reviewed and incorporated        Patient presents for symptomatic anemia secondary to dysmenorrhea due to uterine fibroids. Patient with known cause of bleeding and follow up scheduled.  I discussed at length with the patient the importance of a blood transfusion here today however she states that she needs to go back to work and is unable to stay for an unknown time to get a transfusion.   Patient was informed that a hemoglobin of 5.1 is dangerous and can lead to harmful effects including organ damage, syncope, and possible death.  Patient expressed understanding however still requested to leave AMA with plan to follow up with PMD.  08/20/2020 at 11:00 AM:  The patient requested to leave.  I considered this to be leaving against medical advice. I personally discussed the following with them:  1)  That they currently had a medical condition of symptomatic anemia and I am concerned that they may have orthostatic syncope, cerebral hypoxia, damage to internal organs.    2)  My proposed course of evaluation and treatment includes, but is not limited to, transfusion of packed red blood cells.  Benefits of staying include possible diagnosis or excluding of symptomatic anemia or an alternative serious condition such as worsening anemia, which if identified early would lead to appropriate intervention in a timely manner lessening the burden of disability and death.  3) Risks of leaving before this had been completed include: misdiagnosis, worsening illness leading up to and including prolonged or permanent  disability or death.  Specific risks pertinent, but not all inclusive, of their current medical condition include but are not limited to orthostatic syncope, cerebral hypoxia, and damage to internal organs.  I also discussed alternatives including follow-up with primary care physician for outpatient transfusion.  Despite this they stated they wanted to leave due to time constraints and refused further evaluation, treatment, or admission at this time.   They appeared clinically sober, were mentating appropriately, were free from distracting injury, had adequately controlled acute pain, appeared to have intact insight, judgment, and reason, and in my opinion had the capacity to make this decision.  Specifically, they were able to verbally state back in a coherent manner their current medical  condition/current diagnosis, the proposed course of evaluation and/or treatment, and the risks, benefits, and alternatives of treatment versus leaving against medical advice.   They understand that they may return to seek medical attention here at ANY time they want.  I strongly advised them to return to the Emergency Department immediately if they experience any new or worsening symptoms that concern them, or simply if they reconsider continued evaluation and/or treatment as previously discussed.  This would be without any repercussions, though they understand they likely will need to wait again in the Emergency Department if other patients are in front of them, rather than being brought straight back.  They understood this is another advantage of staying, but still insisted upon leaving.  I recommended they follow-up with their primary care physician as well as OB/GYN at the earliest available opportunity/appointment for further evaluation and treatment.   The patient was discharged against medical advice.  They did accept written discharge instructions.       ____________________________________________   FINAL CLINICAL IMPRESSION(S) / ED DIAGNOSES  Final diagnoses:  Symptomatic anemia  Dysmenorrhea, unspecified  Uterine leiomyoma, unspecified location     ED Discharge Orders    None       Note:  This document was prepared using Dragon voice recognition software and may include unintentional dictation errors.   Naaman Plummer, MD 08/20/20 [phone]

## 2020-08-20 NOTE — ED Triage Notes (Signed)
Pt comes with c/o increased weakness fatigue and dizziness. Pt states she thinks she needs to get her iron checked. Pt does appear pale.  Pt denies any other complaints.

## 2020-08-20 NOTE — ED Notes (Signed)
Pt refusing blood transfusion per EDP and RN's advice. Pt signing out [place] and signing paperwork.

## 2020-08-23 LAB — TYPE AND SCREEN
ABO/RH(D): B POS
Antibody Screen: POSITIVE
